# Patient Record
Sex: Female | Born: 1965 | Race: White | Hispanic: No | Smoking: Never smoker
Health system: Southern US, Community
[De-identification: ages and names within clinical notes are randomized; demographics above are authoritative.]

## PROBLEM LIST (undated history)

## (undated) DIAGNOSIS — IMO0002 Reserved for concepts with insufficient information to code with codable children: Secondary | ICD-10-CM

## (undated) DIAGNOSIS — D219 Benign neoplasm of connective and other soft tissue, unspecified: Secondary | ICD-10-CM

## (undated) DIAGNOSIS — R87619 Unspecified abnormal cytological findings in specimens from cervix uteri: Secondary | ICD-10-CM

## (undated) DIAGNOSIS — J45909 Unspecified asthma, uncomplicated: Secondary | ICD-10-CM

## (undated) DIAGNOSIS — K589 Irritable bowel syndrome without diarrhea: Secondary | ICD-10-CM

## (undated) HISTORY — PX: ABDOMINOPLASTY: SUR9

## (undated) HISTORY — PX: CERVICAL CONE BIOPSY: SUR198

## (undated) HISTORY — DX: Unspecified asthma, uncomplicated: J45.909

## (undated) HISTORY — PX: OTHER SURGICAL HISTORY: SHX169

---

## 1998-12-02 ENCOUNTER — Inpatient Hospital Stay (HOSPITAL_COMMUNITY): Admission: AD | Admit: 1998-12-02 | Discharge: 1998-12-05 | Payer: Self-pay | Admitting: *Deleted

## 1998-12-31 ENCOUNTER — Encounter (HOSPITAL_COMMUNITY): Admission: RE | Admit: 1998-12-31 | Discharge: 1999-03-31 | Payer: Self-pay | Admitting: *Deleted

## 1999-01-03 ENCOUNTER — Inpatient Hospital Stay (HOSPITAL_COMMUNITY): Admission: AD | Admit: 1999-01-03 | Discharge: 1999-01-03 | Payer: Self-pay | Admitting: Obstetrics and Gynecology

## 1999-01-16 ENCOUNTER — Other Ambulatory Visit: Admission: RE | Admit: 1999-01-16 | Discharge: 1999-01-16 | Payer: Self-pay | Admitting: *Deleted

## 1999-07-19 ENCOUNTER — Emergency Department (HOSPITAL_COMMUNITY): Admission: EM | Admit: 1999-07-19 | Discharge: 1999-07-19 | Payer: Self-pay | Admitting: Emergency Medicine

## 2000-03-29 ENCOUNTER — Other Ambulatory Visit: Admission: RE | Admit: 2000-03-29 | Discharge: 2000-03-29 | Payer: Self-pay | Admitting: *Deleted

## 2001-03-14 ENCOUNTER — Encounter: Admission: RE | Admit: 2001-03-14 | Discharge: 2001-03-14 | Payer: Self-pay | Admitting: *Deleted

## 2001-03-14 ENCOUNTER — Encounter: Payer: Self-pay | Admitting: *Deleted

## 2001-03-27 ENCOUNTER — Encounter: Payer: Self-pay | Admitting: *Deleted

## 2001-03-27 ENCOUNTER — Encounter: Admission: RE | Admit: 2001-03-27 | Discharge: 2001-03-27 | Payer: Self-pay | Admitting: *Deleted

## 2001-04-07 ENCOUNTER — Ambulatory Visit (HOSPITAL_COMMUNITY): Admission: RE | Admit: 2001-04-07 | Discharge: 2001-04-07 | Payer: Self-pay | Admitting: Internal Medicine

## 2001-04-07 ENCOUNTER — Encounter (HOSPITAL_BASED_OUTPATIENT_CLINIC_OR_DEPARTMENT_OTHER): Payer: Self-pay | Admitting: Internal Medicine

## 2001-06-16 ENCOUNTER — Other Ambulatory Visit: Admission: RE | Admit: 2001-06-16 | Discharge: 2001-06-16 | Payer: Self-pay | Admitting: *Deleted

## 2002-05-01 ENCOUNTER — Encounter: Admission: RE | Admit: 2002-05-01 | Discharge: 2002-05-01 | Payer: Self-pay | Admitting: Orthopaedic Surgery

## 2002-07-31 ENCOUNTER — Other Ambulatory Visit: Admission: RE | Admit: 2002-07-31 | Discharge: 2002-07-31 | Payer: Self-pay | Admitting: Obstetrics and Gynecology

## 2002-09-26 ENCOUNTER — Encounter: Payer: Self-pay | Admitting: *Deleted

## 2002-09-26 ENCOUNTER — Ambulatory Visit (HOSPITAL_COMMUNITY): Admission: RE | Admit: 2002-09-26 | Discharge: 2002-09-26 | Payer: Self-pay | Admitting: *Deleted

## 2003-08-14 ENCOUNTER — Other Ambulatory Visit: Admission: RE | Admit: 2003-08-14 | Discharge: 2003-08-14 | Payer: Self-pay | Admitting: Obstetrics and Gynecology

## 2003-09-11 ENCOUNTER — Encounter (INDEPENDENT_AMBULATORY_CARE_PROVIDER_SITE_OTHER): Payer: Self-pay | Admitting: Specialist

## 2003-09-11 ENCOUNTER — Ambulatory Visit (HOSPITAL_COMMUNITY): Admission: RE | Admit: 2003-09-11 | Discharge: 2003-09-11 | Payer: Self-pay | Admitting: *Deleted

## 2004-01-28 ENCOUNTER — Emergency Department (HOSPITAL_COMMUNITY): Admission: EM | Admit: 2004-01-28 | Discharge: 2004-01-28 | Payer: Self-pay | Admitting: Emergency Medicine

## 2004-01-31 ENCOUNTER — Encounter: Admission: RE | Admit: 2004-01-31 | Discharge: 2004-01-31 | Payer: Self-pay | Admitting: *Deleted

## 2004-02-10 ENCOUNTER — Encounter: Admission: RE | Admit: 2004-02-10 | Discharge: 2004-05-10 | Payer: Self-pay | Admitting: *Deleted

## 2004-08-18 ENCOUNTER — Other Ambulatory Visit: Admission: RE | Admit: 2004-08-18 | Discharge: 2004-08-18 | Payer: Self-pay | Admitting: Obstetrics and Gynecology

## 2005-01-12 ENCOUNTER — Emergency Department (HOSPITAL_COMMUNITY): Admission: EM | Admit: 2005-01-12 | Discharge: 2005-01-12 | Payer: Self-pay | Admitting: Emergency Medicine

## 2005-02-02 ENCOUNTER — Encounter: Admission: RE | Admit: 2005-02-02 | Discharge: 2005-02-02 | Payer: Self-pay | Admitting: Obstetrics and Gynecology

## 2006-03-31 ENCOUNTER — Encounter: Admission: RE | Admit: 2006-03-31 | Discharge: 2006-03-31 | Payer: Self-pay | Admitting: Obstetrics and Gynecology

## 2007-06-21 ENCOUNTER — Emergency Department (HOSPITAL_COMMUNITY): Admission: EM | Admit: 2007-06-21 | Discharge: 2007-06-21 | Payer: Self-pay | Admitting: Family Medicine

## 2008-09-30 ENCOUNTER — Emergency Department (HOSPITAL_COMMUNITY): Admission: EM | Admit: 2008-09-30 | Discharge: 2008-10-01 | Payer: Self-pay | Admitting: Family Medicine

## 2009-04-25 ENCOUNTER — Encounter: Admission: RE | Admit: 2009-04-25 | Discharge: 2009-04-25 | Payer: Self-pay | Admitting: Family Medicine

## 2010-02-01 ENCOUNTER — Encounter: Payer: Self-pay | Admitting: Obstetrics and Gynecology

## 2010-05-29 NOTE — H&P (Signed)
[facility]  Patient:    Caitlyn Pruitt                          MRN: [id number] Adm. Date:  [id number] Attending:  Frederich Balding                         History and Physical  HISTORY OF PRESENT ILLNESS:   Patient is a 45 year old primigravida married white female, last menstrual period of February 17, her estimated date of confinement  November 24, and an estimated gestational age of 39+ weeks.  By early ultrasound, [facility] was November 14, giving her an estimated gestational age of [redacted] weeks.  Her prenatal course has basically been uncomplicated.                                She presented to the office yesterday, was evaluated by Dr. Malachy Mood.  An ultrasound at that time revealed an estimated fetal weight of  4283 grams.  Amniotic fluid index was normal.  Cervix was favorable, and the patient is brought in today to undergo artificial rupture of membranes for induction of labor.  Her group B strep is negative.  She did have an elevated one-hour glucose tolerance test.  However, her three-hour glucose tolerance test was within normal limits.  ALLERGIES:                    Patient has no known drug allergies.  MEDICATIONS:                  Include prenatal vitamins.  PRENATAL LABORATORY:          Patient is O positive, negative antibody screen, nonreactive serology, positive rubella screen, negative hepatitis B surface antigen.  PAST MEDICAL HISTORY, FAMILY HISTORY, SOCIAL HISTORY:      Please see prenatal records.  REVIEW OF SYSTEMS:            Noncontributory.  PHYSICAL EXAMINATION:  VITAL SIGNS:                  Patient afebrile, stable vital signs.  Blood pressure may be slightly elevated, 140-150/80-90.  LUNGS:                        Clear.  CARDIOVASCULAR:               Regular rate, grade 2/6 systolic ejection murmur, no clicks or gallops.  ABDOMEN:                      Gravid uterus.  Large estimated fetal  weight noted.  PELVIC:                       Cervix is 2 cm, 80% effaced, vertex, and -1 station. Artificial rupture of membranes revealed 1+ meconium-stained fluid.  EXTREMITIES:                  Deep tendon reflexes are 2+ and no clonus.  She does have 1+ edema.  FETAL HEART RATE:             Reactive without decelerations.  IMPRESSION:                   1. Intrauterine pregnancy at term with  large                                  estimated fetal weight.                               2. Meconium-stained amniotic fluid.  PLAN:                         In terms of the large estimated fetal weight, we ave given the patient two options.  There is the option of primary cesarean section for large estimated fetal weight versus trial of labor.  We have discussed the potential risks with fetal macrosomia in terms of birth trauma, with associated  shoulder dystocia and its implications.  Patient and her husband discussed their issues.  All questions were answered, and the patient has decided to proceed with the trial of labor.  Again, we will not proceed with any type of instrument delivery for a vaginal delivery.  In terms of the meconium-stained fluid, we discussed its implications and management.  Discussed the potential for meconium aspiration.  Because it is so light at this point in time, amnioinfusion will not be undertaken, unless it does become thicker.  We have discussed its management at delivery.  All questions of the patient were answered.  Will proceed with AROM.                                Because of slightly elevated blood pressure, PIH panel will be obtained. DD:  12/02/98 TD:  12/02/98 Job: [id number]

## 2010-05-29 NOTE — Op Note (Signed)
NAME:  Caitlyn Pruitt, Caitlyn Pruitt                          ACCOUNT NO.:  [id number]   MEDICAL RECORD NO.:  [id number]                   PATIENT TYPE:  AMB   LOCATION:  DAY                                  FACILITY:  [facility]   PHYSICIAN:  Vikki Ports, M.D.         DATE OF BIRTH:  28-Apr-1965   DATE OF PROCEDURE:  09/11/2003  DATE OF DISCHARGE:                                 OPERATIVE REPORT   PREOPERATIVE DIAGNOSIS:  Grade II and III internal hemorrhoids.   POSTOPERATIVE DIAGNOSIS:  Grade II and III internal hemorrhoids.   PROCEDURE:  Procedure for prolapse and hemorrhoids rectopexy.   SURGEON:  Vikki Ports, M.D.   ANESTHESIA:  General.   DESCRIPTION OF PROCEDURE:  Patient was taken to the operating room and  placed in a supine position.  After adequate endotracheal anesthesia was  induced using an endotracheal tube, she was placed in a prone jack-knife  position.  Rectal and perianal prep was undertaken.  The three hemorrhoidal  bundles were injected with 0.5% Marcaine with Wydase, and then the internal  and external sphincter muscles were injected with 30 cc of 0.5% Marcaine.  First, anal dilatation was accomplished to three digits.  I then placed a 2-  0 purse-string suture circumferentially around the rectal mucosa 5 cm  proximal to the dentate line.  The stapler was then placed, fired, and  removed.  There was a good donut of tissue.  Prior to firing, I checked the  vagina, and there was no evidence of wall involvement.  The staple line was  assessed.  Adequate hemostasis was insured.  Gelfoam packing was placed in  the rectum.  Patient tolerated the procedure well and went to the PACU in  good condition.                                               Vikki Ports, M.D.    KRH/MEDQ  D:  09/11/2003  T:  09/11/2003  Job:  [id number]

## 2010-10-08 LAB — CBC
HCT: 40.7
MCHC: 34.4
RDW: 12.3

## 2010-10-08 LAB — DIFFERENTIAL
Basophils Relative: 1
Monocytes Relative: 9
Neutro Abs: 2.3

## 2010-10-13 ENCOUNTER — Other Ambulatory Visit: Payer: Self-pay | Admitting: Internal Medicine

## 2010-10-13 DIAGNOSIS — R1011 Right upper quadrant pain: Secondary | ICD-10-CM

## 2010-10-14 ENCOUNTER — Ambulatory Visit
Admission: RE | Admit: 2010-10-14 | Discharge: 2010-10-14 | Disposition: A | Discharge status: Home / Self Care | Payer: 59 | Source: Ambulatory Visit | Attending: Internal Medicine | Admitting: Internal Medicine

## 2010-10-14 DIAGNOSIS — R1011 Right upper quadrant pain: Secondary | ICD-10-CM

## 2010-12-16 ENCOUNTER — Ambulatory Visit
Admission: RE | Admit: 2010-12-16 | Discharge: 2010-12-16 | Disposition: A | Discharge status: Home / Self Care | Payer: 59 | Source: Ambulatory Visit | Attending: Allergy and Immunology | Admitting: Allergy and Immunology

## 2010-12-16 ENCOUNTER — Other Ambulatory Visit: Payer: Self-pay | Admitting: Allergy and Immunology

## 2010-12-16 DIAGNOSIS — R0602 Shortness of breath: Secondary | ICD-10-CM

## 2011-03-24 ENCOUNTER — Other Ambulatory Visit: Payer: Self-pay | Admitting: Family Medicine

## 2011-03-24 ENCOUNTER — Ambulatory Visit
Admission: RE | Admit: 2011-03-24 | Discharge: 2011-03-24 | Disposition: A | Discharge status: Home / Self Care | Payer: 59 | Source: Ambulatory Visit | Attending: Family Medicine | Admitting: Family Medicine

## 2011-03-24 DIAGNOSIS — M25511 Pain in right shoulder: Secondary | ICD-10-CM

## 2011-04-01 ENCOUNTER — Other Ambulatory Visit: Payer: Self-pay | Admitting: Family Medicine

## 2011-04-01 DIAGNOSIS — M25511 Pain in right shoulder: Secondary | ICD-10-CM

## 2011-04-02 ENCOUNTER — Other Ambulatory Visit: Payer: Self-pay | Admitting: Orthopedic Surgery

## 2011-04-02 DIAGNOSIS — M25511 Pain in right shoulder: Secondary | ICD-10-CM

## 2011-04-07 ENCOUNTER — Ambulatory Visit
Admission: RE | Admit: 2011-04-07 | Discharge: 2011-04-07 | Disposition: A | Discharge status: Home / Self Care | Payer: 59 | Source: Ambulatory Visit | Attending: Orthopedic Surgery | Admitting: Orthopedic Surgery

## 2011-04-07 DIAGNOSIS — M25511 Pain in right shoulder: Secondary | ICD-10-CM

## 2011-09-15 ENCOUNTER — Encounter (HOSPITAL_COMMUNITY): Payer: Self-pay | Admitting: Emergency Medicine

## 2011-09-15 DIAGNOSIS — R1011 Right upper quadrant pain: Secondary | ICD-10-CM | POA: Insufficient documentation

## 2011-09-15 LAB — URINALYSIS, ROUTINE W REFLEX MICROSCOPIC
Glucose, UA: NEGATIVE mg/dL
Ketones, ur: NEGATIVE mg/dL
Nitrite: NEGATIVE
Urobilinogen, UA: 0.2 mg/dL (ref 0.0–1.0)

## 2011-09-15 LAB — POCT PREGNANCY, URINE: Preg Test, Ur: NEGATIVE

## 2011-09-15 LAB — CBC WITH DIFFERENTIAL/PLATELET
Basophils Absolute: 0.1 10*3/uL (ref 0.0–0.1)
Eosinophils Relative: 2 % (ref 0–5)
HCT: 38.8 % (ref 36.0–46.0)
Lymphocytes Relative: 26 % (ref 12–46)
MCH: 30.3 pg (ref 26.0–34.0)
MCHC: 34 g/dL (ref 30.0–36.0)
Neutrophils Relative %: 66 % (ref 43–77)
RDW: 12.5 % (ref 11.5–15.5)

## 2011-09-15 LAB — URINE MICROSCOPIC-ADD ON

## 2011-09-15 NOTE — ED Notes (Signed)
RUQ pain she believes is gallbladder related; pain is constant. Pain after ovulation on right side. Has hx of fibroids removed.

## 2011-09-16 ENCOUNTER — Emergency Department (HOSPITAL_COMMUNITY)
Admission: EM | Admit: 2011-09-16 | Discharge: 2011-09-16 | Discharge status: Against Medical Advice | Payer: 59 | Attending: Emergency Medicine | Admitting: Emergency Medicine

## 2011-09-16 HISTORY — DX: Irritable bowel syndrome, unspecified: K58.9

## 2011-09-16 LAB — COMPREHENSIVE METABOLIC PANEL
AST: 196 U/L — ABNORMAL HIGH (ref 0–37)
BUN: 12 mg/dL (ref 6–23)
CO2: 27 mEq/L (ref 19–32)
Calcium: 9.9 mg/dL (ref 8.4–10.5)
GFR calc non Af Amer: 67 mL/min — ABNORMAL LOW (ref >90)
Potassium: 3.5 mEq/L (ref 3.5–5.1)
Total Protein: 6.9 g/dL (ref 6.0–8.3)

## 2011-09-30 ENCOUNTER — Other Ambulatory Visit: Payer: Self-pay | Admitting: Internal Medicine

## 2011-09-30 DIAGNOSIS — R1011 Right upper quadrant pain: Secondary | ICD-10-CM

## 2011-09-30 DIAGNOSIS — R748 Abnormal levels of other serum enzymes: Secondary | ICD-10-CM

## 2011-10-04 ENCOUNTER — Ambulatory Visit
Admission: RE | Admit: 2011-10-04 | Discharge: 2011-10-04 | Disposition: A | Discharge status: Home / Self Care | Payer: 59 | Source: Ambulatory Visit | Attending: Internal Medicine | Admitting: Internal Medicine

## 2011-10-04 DIAGNOSIS — R748 Abnormal levels of other serum enzymes: Secondary | ICD-10-CM

## 2011-10-04 DIAGNOSIS — R1011 Right upper quadrant pain: Secondary | ICD-10-CM

## 2011-10-05 ENCOUNTER — Other Ambulatory Visit: Payer: Self-pay | Admitting: Internal Medicine

## 2011-10-05 DIAGNOSIS — R109 Unspecified abdominal pain: Secondary | ICD-10-CM

## 2011-10-11 ENCOUNTER — Other Ambulatory Visit: Payer: 59

## 2011-10-18 ENCOUNTER — Other Ambulatory Visit: Payer: 59

## 2011-10-20 ENCOUNTER — Ambulatory Visit
Admission: RE | Admit: 2011-10-20 | Discharge: 2011-10-20 | Disposition: A | Discharge status: Home / Self Care | Payer: 59 | Source: Ambulatory Visit | Attending: Internal Medicine | Admitting: Internal Medicine

## 2011-10-20 DIAGNOSIS — R109 Unspecified abdominal pain: Secondary | ICD-10-CM

## 2011-10-20 MED ORDER — GADOBENATE DIMEGLUMINE 529 MG/ML IV SOLN
16.0000 mL | Freq: Once | INTRAVENOUS | Status: AC | PRN
  Administered 2011-10-20: 16 mL via INTRAVENOUS

## 2011-10-21 ENCOUNTER — Other Ambulatory Visit: Payer: 59

## 2011-10-26 ENCOUNTER — Other Ambulatory Visit: Payer: Self-pay | Admitting: Obstetrics and Gynecology

## 2012-01-02 ENCOUNTER — Ambulatory Visit (INDEPENDENT_AMBULATORY_CARE_PROVIDER_SITE_OTHER): Payer: 59 | Admitting: Family Medicine

## 2012-01-02 VITALS — BP 101/65 | HR 56 | Temp 98.4°F | Resp 17 | Ht 68.5 in | Wt 176.0 lb

## 2012-01-02 DIAGNOSIS — J3489 Other specified disorders of nose and nasal sinuses: Secondary | ICD-10-CM

## 2012-01-02 DIAGNOSIS — J310 Chronic rhinitis: Secondary | ICD-10-CM

## 2012-01-02 DIAGNOSIS — J019 Acute sinusitis, unspecified: Secondary | ICD-10-CM

## 2012-01-02 DIAGNOSIS — R0981 Nasal congestion: Secondary | ICD-10-CM

## 2012-01-02 MED ORDER — IPRATROPIUM BROMIDE 0.02 % IN SOLN: 500.0000 ug | Freq: Four times a day (QID) | RESPIRATORY_TRACT | Status: DC

## 2012-01-02 MED ORDER — AZITHROMYCIN 250 MG PO TABS: ORAL_TABLET | ORAL | Status: DC

## 2012-01-02 MED ORDER — BENZONATATE 100 MG PO CAPS: 100.0000 mg | ORAL_CAPSULE | Freq: Three times a day (TID) | ORAL | Status: DC | PRN

## 2012-01-02 NOTE — Progress Notes (Signed)
[facility]:  Office Visit  Chief Complaint:  Chief Complaint  Patient presents with  . Sinusitis    pressure and headache     HPI: Caitlyn Pruitt is a 46 y.o. female who complains of  Sinus pressure, tried ibuprofen, nasal washes, gargling, minimal fevers and chill, + frontal HA.  Green to brown.  No allergies or asthma. Has IBS and prior sinus issues. Cough but no sputum.   Past Medical History  Diagnosis Date  . Fibroids   . Irritable bowel syndrome (IBS)    Past Surgical History  Procedure Date  . Turbinate reconstruction    History   Social History  . Marital Status: Married    Spouse Name: N/A    Number of Children: N/A  . Years of Education: N/A   Social History Main Topics  . Smoking status: Never Smoker   . Smokeless tobacco: None  . Alcohol Use: None  . Drug Use: None  . Sexually Active: None   Other Topics Concern  . None   Social History Narrative  . None   No family history on file. No Known Allergies Prior to Admission medications   Medication Sig Start Date End Date Taking? Authorizing Provider  ALPRAZolam (XANAX) 0.25 MG tablet Take 0.125 mg by mouth at bedtime as needed.   Yes Historical Provider, MD  Azelaic Acid (FINACEA) 15 % cream Apply 1 application topically daily. After skin is thoroughly washed and patted dry, gently but thoroughly massage a thin film of azelaic acid cream into the affected area twice daily, in the morning and evening.   Yes Historical Provider, MD  fish oil-omega-3 fatty acids 1000 MG capsule Take 1 g by mouth daily.   Yes Historical Provider, MD  Magnesium Hydroxide (MAGNESIA PO) Take 1 tablet by mouth daily.   Yes Historical Provider, MD  Probiotic Product (ALIGN PO) Take 1 tablet by mouth daily.   Yes Historical Provider, MD     ROS: The patient denies fevers, chills, night sweats, unintentional weight loss, chest pain, palpitations, wheezing, dyspnea on exertion, nausea, vomiting, abdominal pain,  dysuria, hematuria, melena, numbness, weakness, or tingling.   All other systems have been reviewed and were otherwise negative with the exception of those mentioned in the HPI and as above.    PHYSICAL EXAM: Filed Vitals:   01/02/12 0938  BP: 101/65  Pulse: 56  Temp: 98.4 F (36.9 C)  Resp: 17   Filed Vitals:   01/02/12 0938  Height: 5' 8.5" (1.74 m)  Weight: 176 lb (79.833 kg)   Body mass index is 26.37 kg/(m^2).  General: Alert, no acute distress HEENT:  Normocephalic, atraumatic, oropharynx patent. + sinus tenderness, Tm nl Cardiovascular:  Regular rate and rhythm, no rubs murmurs or gallops.  No Carotid bruits, radial pulse intact. No pedal edema.  Respiratory: Clear to auscultation bilaterally.  No wheezes, rales, or rhonchi.  No cyanosis, no use of accessory musculature GI: No organomegaly, abdomen is soft and non-tender, positive bowel sounds.  No masses. Skin: No rashes. Neurologic: Facial musculature symmetric. Psychiatric: Patient is appropriate throughout our interaction. Lymphatic: No cervical lymphadenopathy Musculoskeletal: Gait intact.   LABS:    EKG/XRAY:   Primary read interpreted by Dr. Conley Rolls at [facility].   ASSESSMENT/PLAN: Encounter Diagnoses  Name Primary?  . Acute sinusitis Yes  . Rhinitis, non-allergic   . Nasal congestion    Does not want augmentin, would like z pack due to IBS Will Rx z pack, tessalon  perels, atrovent NS F/u prn    Shakedra Beam PHUONG, DO 01/02/2012 10:38 AM

## 2012-01-03 ENCOUNTER — Telehealth: Payer: Self-pay | Admitting: Radiology

## 2012-01-03 MED ORDER — IPRATROPIUM BROMIDE 0.03 % NA SOLN: 2.0000 | Freq: Two times a day (BID) | NASAL | Status: DC

## 2012-01-03 NOTE — Telephone Encounter (Signed)
Call from pharmacy, got atrovent solution for nebulizer. She does not have a nebulizer, reviewed chart this is to be nasal spray. I have corrected this.

## 2012-01-06 ENCOUNTER — Telehealth: Payer: Self-pay

## 2012-01-06 MED ORDER — IPRATROPIUM BROMIDE 0.02 % IN SOLN: 500.0000 ug | Freq: Four times a day (QID) | RESPIRATORY_TRACT | Status: DC

## 2012-01-06 NOTE — Telephone Encounter (Signed)
ipratropium (ATROVENT) 0.03 % nasal spray   Needs an inhaler, spray isn't what she was to get, needs the nebulizer.  Call [phone]  [facility] [address]

## 2012-01-06 NOTE — Telephone Encounter (Signed)
I sent in the nebulizer solution, pharmacy had called previously to state patient needed nasal spray, not nebulizer solution, now patient states she is to get nebulizer, where is she to get the machine?

## 2012-01-07 ENCOUNTER — Other Ambulatory Visit: Payer: Self-pay | Admitting: Family Medicine

## 2012-01-07 DIAGNOSIS — J9801 Acute bronchospasm: Secondary | ICD-10-CM

## 2012-01-07 MED ORDER — ALBUTEROL SULFATE HFA 108 (90 BASE) MCG/ACT IN AERS: 2.0000 | INHALATION_SPRAY | Freq: Four times a day (QID) | RESPIRATORY_TRACT | Status: DC | PRN

## 2012-01-07 NOTE — Telephone Encounter (Signed)
She is having bronchospasms, but feels much better, would like a rx for albuterol INH with spacer. I have ordered it to same pharmacy.

## 2012-01-07 NOTE — Telephone Encounter (Signed)
LM for her to call me back, need clarification on why she ants nebs since she was not wheezing, does not have h/o asthma.

## 2012-02-10 ENCOUNTER — Ambulatory Visit
Admission: RE | Admit: 2012-02-10 | Discharge: 2012-02-10 | Disposition: A | Discharge status: Home / Self Care | Payer: 59 | Source: Ambulatory Visit | Attending: Allergy and Immunology | Admitting: Allergy and Immunology

## 2012-02-10 ENCOUNTER — Other Ambulatory Visit: Payer: Self-pay | Admitting: Allergy and Immunology

## 2012-02-10 DIAGNOSIS — R0602 Shortness of breath: Secondary | ICD-10-CM

## 2012-02-19 ENCOUNTER — Ambulatory Visit (INDEPENDENT_AMBULATORY_CARE_PROVIDER_SITE_OTHER): Payer: 59 | Admitting: Emergency Medicine

## 2012-02-19 VITALS — BP 121/83 | HR 70 | Temp 98.2°F | Resp 16 | Ht 69.0 in | Wt 172.0 lb

## 2012-02-19 DIAGNOSIS — R05 Cough: Secondary | ICD-10-CM

## 2012-02-19 DIAGNOSIS — R0989 Other specified symptoms and signs involving the circulatory and respiratory systems: Secondary | ICD-10-CM

## 2012-02-19 DIAGNOSIS — Z9109 Other allergy status, other than to drugs and biological substances: Secondary | ICD-10-CM | POA: Insufficient documentation

## 2012-02-19 DIAGNOSIS — R059 Cough, unspecified: Secondary | ICD-10-CM

## 2012-02-19 DIAGNOSIS — J45909 Unspecified asthma, uncomplicated: Secondary | ICD-10-CM | POA: Insufficient documentation

## 2012-02-19 DIAGNOSIS — J209 Acute bronchitis, unspecified: Secondary | ICD-10-CM

## 2012-02-19 HISTORY — DX: Unspecified asthma, uncomplicated: J45.909

## 2012-02-19 MED ORDER — ALBUTEROL SULFATE (2.5 MG/3ML) 0.083% IN NEBU: 2.5000 mg | INHALATION_SOLUTION | Freq: Once | RESPIRATORY_TRACT | Status: DC

## 2012-02-19 MED ORDER — PREDNISONE 10 MG PO TABS: ORAL_TABLET | ORAL | Status: DC

## 2012-02-19 MED ORDER — AMOXICILLIN 875 MG PO TABS: 875.0000 mg | ORAL_TABLET | Freq: Two times a day (BID) | ORAL | Status: DC

## 2012-02-19 NOTE — Progress Notes (Signed)
  Subjective:    Patient ID: Caitlyn Pruitt, female    DOB: 11/30/1965, 47 y.o.   MRN: [id number]  HPI Patient presents with productive cough and chest congestion.  She is also experiencing stress where she is not eating or sleeping well.  She was sick the beginning of January and was placed on an antibiotic and inhaler.  She felt better, but still had lingering symptoms. Her son was sick 10 days ago and she started  to feel congestion, cough and drainage.      Review of Systems she is under a significant amount of stress at home. She is having difficulty with her children her husband is having an affair and moving to [place] and she's not been able to sleep. She has been seeing a therapist regular     Objective:   Physical Exam patient is alert cooperative in no distress. Her TMs are clear. Nose is somewhat congested. The throat is status post TNA. Neck is supple. Chest exam reveals rhonchi present bilaterally minimal wheezing noted.        Assessment & Plan:  The patient his son after nebulizer treatment we'll go ahead and treat with amoxicillin 875 twice a day along with prednisone taper in that she responded well to this before. I encouraged her to see her therapist regular to help her with her life stresses.

## 2012-02-21 ENCOUNTER — Telehealth: Payer: Self-pay

## 2012-02-21 MED ORDER — ALBUTEROL SULFATE (2.5 MG/3ML) 0.083% IN NEBU: 2.5000 mg | INHALATION_SOLUTION | Freq: Four times a day (QID) | RESPIRATORY_TRACT | Status: DC | PRN

## 2012-02-21 NOTE — Telephone Encounter (Signed)
Sending in Rx for Albuterol for neb to Caitlyn Pruitt and faxed Rx for nebulizer to [facility]. Notified pt that both were done and pt will CB if she hasn't heard from [facility] in a couple of days.

## 2012-02-21 NOTE — Telephone Encounter (Signed)
PT STATES SHE WAS GIVEN A BREATHING TREATMENT WHEN SHE CAME IN THE OTHER DAY AND WOULD LIKE TO KNOW WHAT WAS IN IT BECAUSE IT WORKED SO MUCH BETTER THAN WHAT SHE USE TO HAVE. PLEASE CALL [phone]

## 2012-02-21 NOTE — Telephone Encounter (Signed)
Reported to pt that she was given albuterol in her nebulizer treatment at [facility]. Pt stated that it works so much better than her inhaler and requests Rx for a nebulizer machine and the albuterol to use in the machine if possible. Dr Cleta Alberts, do you want to Rx these for pt?

## 2012-02-21 NOTE — Telephone Encounter (Signed)
IT would be okay to send a prescription into [facility] for nebulizer. She would have to change to the albuterol 2.5 mg per 3 cc unit dose  for the  nebulizer. She can have #25 with one refill

## 2012-02-29 ENCOUNTER — Other Ambulatory Visit: Payer: Self-pay | Admitting: Allergy and Immunology

## 2012-02-29 ENCOUNTER — Ambulatory Visit
Admission: RE | Admit: 2012-02-29 | Discharge: 2012-02-29 | Disposition: A | Discharge status: Home / Self Care | Payer: 59 | Source: Ambulatory Visit | Attending: Allergy and Immunology | Admitting: Allergy and Immunology

## 2012-02-29 DIAGNOSIS — J45901 Unspecified asthma with (acute) exacerbation: Secondary | ICD-10-CM

## 2012-03-18 ENCOUNTER — Other Ambulatory Visit: Payer: Self-pay | Admitting: Emergency Medicine

## 2012-03-20 NOTE — Telephone Encounter (Signed)
If she still needs this, she needs to RTC or follow-up with her PCP.

## 2012-03-29 ENCOUNTER — Other Ambulatory Visit: Payer: Self-pay | Admitting: Emergency Medicine

## 2012-03-30 ENCOUNTER — Ambulatory Visit
Admission: RE | Admit: 2012-03-30 | Discharge: 2012-03-30 | Disposition: A | Discharge status: Home / Self Care | Payer: 59 | Source: Ambulatory Visit | Attending: Internal Medicine | Admitting: Internal Medicine

## 2012-03-30 ENCOUNTER — Other Ambulatory Visit: Payer: Self-pay | Admitting: Internal Medicine

## 2012-03-30 DIAGNOSIS — R059 Cough, unspecified: Secondary | ICD-10-CM

## 2012-03-30 DIAGNOSIS — R05 Cough: Secondary | ICD-10-CM

## 2012-03-31 ENCOUNTER — Ambulatory Visit
Admission: RE | Admit: 2012-03-31 | Discharge: 2012-03-31 | Disposition: A | Discharge status: Home / Self Care | Payer: 59 | Source: Ambulatory Visit | Attending: Internal Medicine | Admitting: Internal Medicine

## 2012-05-02 ENCOUNTER — Other Ambulatory Visit: Payer: Self-pay | Admitting: Obstetrics and Gynecology

## 2012-05-23 ENCOUNTER — Institutional Professional Consult (permissible substitution): Payer: 59 | Admitting: Internal Medicine

## 2012-05-31 ENCOUNTER — Institutional Professional Consult (permissible substitution): Payer: 59 | Admitting: Emergency Medicine

## 2012-06-20 ENCOUNTER — Other Ambulatory Visit: Payer: Self-pay | Admitting: Orthopedic Surgery

## 2012-06-20 DIAGNOSIS — M542 Cervicalgia: Secondary | ICD-10-CM

## 2012-06-26 ENCOUNTER — Other Ambulatory Visit: Payer: 59

## 2012-06-29 ENCOUNTER — Ambulatory Visit
Admission: RE | Admit: 2012-06-29 | Discharge: 2012-06-29 | Disposition: A | Discharge status: Home / Self Care | Payer: 59 | Source: Ambulatory Visit | Attending: Orthopedic Surgery | Admitting: Orthopedic Surgery

## 2012-06-29 DIAGNOSIS — M542 Cervicalgia: Secondary | ICD-10-CM

## 2012-07-06 ENCOUNTER — Encounter: Payer: Self-pay | Admitting: Emergency Medicine

## 2012-07-06 ENCOUNTER — Ambulatory Visit (INDEPENDENT_AMBULATORY_CARE_PROVIDER_SITE_OTHER): Payer: 59 | Admitting: Emergency Medicine

## 2012-07-06 VITALS — HR 77 | Temp 98.3°F | Ht 69.0 in | Wt 174.2 lb

## 2012-07-06 DIAGNOSIS — J45909 Unspecified asthma, uncomplicated: Secondary | ICD-10-CM

## 2012-07-06 DIAGNOSIS — J4520 Mild intermittent asthma, uncomplicated: Secondary | ICD-10-CM

## 2012-07-06 NOTE — Patient Instructions (Addendum)
We will stop your flovent and singulair for now Continue to use fluticasone nasal spray Have albuterol available to use as needed We will consider a pH probe test at some point in the future We will perform full Pulmonary Function Testing at your next office visit Follow with Dr Delton Coombes next available appointment with full PFT.

## 2012-07-06 NOTE — Assessment & Plan Note (Addendum)
Clinical picture sounds like asthma, with apparent triggers. Suspect one of these is allergic rhinitis - not currently active but has often been a contributor. Also may be a component of GERD. She is hesitant to start empiric PPI, but would be willing to work up further at some point if we deem necessary.   We will stop your flovent and singulair for now so that these will not affect PFT Continue to use fluticasone nasal spray Have albuterol available to use as needed We will consider a pH probe test at some point in the future We will perform full Pulmonary Function Testing at your next office visit Follow with Dr Delton Coombes next available appointment with full PFT.

## 2012-07-06 NOTE — Progress Notes (Signed)
Subjective:    Patient ID: Caitlyn Pruitt, female    DOB: August 11, 1965, 47 y.o.   MRN: [id number]  HPI 47 yo never smoker, hx of IBS, allergic rhinitis, asthma dx by Dr Irena Cords in march '14. She has family hx of COPD and asthma.  She reports URI sx last Fall, has never really felt well since. Has experienced wheeze, cough. Has identified triggers of cold air, some fumes, exercise, possibly GERD. Treated with pred + abx. Was evaluated by Dr Jarold Motto, underwent spirometry. Has been treated with atrovent/pulmocort nebs not currently, flovent since March. She has noted a clinical response to albuterol.  She is on fluticasone, singulair.    Review of Systems  Constitutional: Negative for fever and unexpected weight change.  HENT: Negative for ear pain, nosebleeds, congestion, sore throat, rhinorrhea, sneezing, trouble swallowing, dental problem, postnasal drip and sinus pressure.   Eyes: Negative for redness and itching.  Respiratory: Positive for cough and shortness of breath. Negative for chest tightness and wheezing.   Cardiovascular: Negative for palpitations and leg swelling.  Gastrointestinal: Negative for nausea and vomiting.  Genitourinary: Negative for dysuria.  Musculoskeletal: Negative for joint swelling.  Skin: Negative for rash.  Neurological: Negative for headaches.  Hematological: Does not bruise/bleed easily.  Psychiatric/Behavioral: Negative for dysphoric mood. The patient is not nervous/anxious.    Past Medical History  Diagnosis Date  . Fibroids   . Irritable bowel syndrome (IBS)   . Asthma 02/19/2012     Family History  Problem Relation Age of Onset  . Heart disease Mother   . Heart disease Father   . Heart disease Sister   . Heart disease Brother   . Emphysema Father   . Emphysema Mother   . Asthma Mother   . Cancer Maternal Grandfather     leukemia  . Cancer Mother     squamous cell skin cancer     History   Social History  . Marital Status: Married   Spouse Name: N/A    Number of Children: 0  . Years of Education: N/A   Occupational History  . homemaker    Social History Main Topics  . Smoking status: Never Smoker   . Smokeless tobacco: Never Used  . Alcohol Use: Yes     Comment: social  . Drug Use: No  . Sexually Active: Not on file   Other Topics Concern  . Not on file   Social History Narrative  . No narrative on file     No Known Allergies   Outpatient Prescriptions Prior to Visit  Medication Sig Dispense Refill  . albuterol (PROVENTIL HFA;VENTOLIN HFA) 108 (90 BASE) MCG/ACT inhaler Inhale 2 puffs into the lungs every 6 (six) hours as needed for wheezing. Use with spacer  1 Inhaler  0  . albuterol (PROVENTIL) (2.5 MG/3ML) 0.083% nebulizer solution USE VIA NEBULIZER EVERY 6 HOURS AS NEEDED FOR WHEEZING  75 mL  4  . ALPRAZolam (XANAX) 0.25 MG tablet Take 0.125 mg by mouth at bedtime as needed.      . Azelaic Acid (FINACEA) 15 % cream Apply 1 application topically daily. After skin is thoroughly washed and patted dry, gently but thoroughly massage a thin film of azelaic acid cream into the affected area twice daily, in the morning and evening.      . fluticasone (FLONASE) 50 MCG/ACT nasal spray Place 2 sprays into the nose daily.      . fluticasone (FLOVENT HFA) 110 MCG/ACT inhaler Inhale 1  puff into the lungs 2 (two) times daily.      . Magnesium Hydroxide (MAGNESIA PO) Take 1 tablet by mouth daily.      . Probiotic Product (ALIGN PO) Take 1 tablet by mouth daily.      Marland Kitchen amoxicillin (AMOXIL) 875 MG tablet Take 1 tablet (875 mg total) by mouth 2 (two) times daily.  20 tablet  0  . predniSONE (DELTASONE) 10 MG tablet Take 4 a day for 3 days 3 a day for 3 days 2 a day for 3 days one a day  30 tablet  0   Facility-Administered Medications Prior to Visit  Medication Dose Route Frequency Provider Last Rate Last Dose  . albuterol (PROVENTIL) (2.5 MG/3ML) 0.083% nebulizer solution 2.5 mg  2.5 mg Nebulization Once Collene Gobble, MD           Objective:   Physical Exam Filed Vitals:   07/06/12 1506  Pulse: 77  Temp: 98.3 F (36.8 C)  TempSrc: Oral  Height: 5\' 9"  (1.753 m)  Weight: 174 lb 3.2 oz (79.017 kg)  SpO2: 97%  Gen: Pleasant, well-nourished, in no distress,  normal affect  ENT: No lesions,  mouth clear,  oropharynx clear, no postnasal drip  Neck: No JVD, no TMG, no carotid bruits  Lungs: No use of accessory muscles, no dullness to percussion, clear without rales or rhonchi  Cardiovascular: RRR, heart sounds normal, no murmur or gallops, no peripheral edema  Musculoskeletal: No deformities, no cyanosis or clubbing  Neuro: alert, non focal  Skin: Warm, no lesions or rashes     Assessment & Plan:  Asthma Clinical picture sounds like asthma, with apparent triggers. Suspect one of these is allergic rhinitis - not currently active but has often been a contributor. Also may be a component of GERD. She is hesitant to start empiric PPI, but would be willing to work up further at some point if we deem necessary.   We will stop your flovent and singulair for now so that these will not affect PFT Continue to use fluticasone nasal spray Have albuterol available to use as needed We will consider a pH probe test at some point in the future We will perform full Pulmonary Function Testing at your next office visit Follow with Dr Delton Coombes next available appointment with full PFT.

## 2012-08-22 ENCOUNTER — Other Ambulatory Visit: Payer: Self-pay | Admitting: Obstetrics and Gynecology

## 2012-08-29 ENCOUNTER — Ambulatory Visit (INDEPENDENT_AMBULATORY_CARE_PROVIDER_SITE_OTHER): Payer: 59 | Admitting: Emergency Medicine

## 2012-08-29 ENCOUNTER — Encounter: Payer: Self-pay | Admitting: Emergency Medicine

## 2012-08-29 VITALS — BP 122/82 | HR 77 | Temp 98.2°F | Ht 68.0 in | Wt 175.0 lb

## 2012-08-29 DIAGNOSIS — J45909 Unspecified asthma, uncomplicated: Secondary | ICD-10-CM

## 2012-08-29 LAB — PULMONARY FUNCTION TEST

## 2012-08-29 NOTE — Progress Notes (Signed)
PFT done today. 

## 2012-08-29 NOTE — Patient Instructions (Addendum)
Continue to use albuterol as needed and before exercise.  Continue fluticasone nasal spray Restart your singulair daily Start omeprazole 20mg  daily Start either loratadine or allegra once a day after several weeks to see if they give additional benefit to the above Call in several weeks to update [place] on which combination is helpful Follow with Dr Delton Coombes in 6 months or sooner if you have any problems

## 2012-08-29 NOTE — Progress Notes (Signed)
  Subjective:    Patient ID: Caitlyn Pruitt, female    DOB: November 14, 1965, 47 y.o.   MRN: [id number]  HPI 47 yo never smoker, hx of IBS, allergic rhinitis, asthma dx by Dr Irena Cords in march '14. She has family hx of COPD and asthma.  She reports URI sx last Fall, has never really felt well since. Has experienced wheeze, cough. Has identified triggers of cold air, some fumes, exercise, possibly GERD. Treated with pred + abx. Was evaluated by Dr Jarold Motto, underwent spirometry. Has been treated with atrovent/pulmocort nebs not currently, flovent since March. She has noted a clinical response to albuterol.  She is on fluticasone, singulair.   ROV 08/29/12 -- follows up for dyspnea, allergies, suspected asthma. Suspected contributions from allergies and GERD (not currently treated). She is off singulair, flovent. She has been on fluticasone. 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 She continues to cough. Uses and benefits from albuterol before exercise.    Review of Systems  Constitutional: Negative for fever and unexpected weight change.  HENT: Negative for ear pain, nosebleeds, congestion, sore throat, rhinorrhea, sneezing, trouble swallowing, dental problem, postnasal drip and sinus pressure.   Eyes: Negative for redness and itching.  Respiratory: Positive for cough and shortness of breath. Negative for chest tightness and wheezing.   Cardiovascular: Negative for palpitations and leg swelling.  Gastrointestinal: Negative for nausea and vomiting.  Genitourinary: Negative for dysuria.  Musculoskeletal: Negative for joint swelling.  Skin: Negative for rash.  Neurological: Negative for headaches.  Hematological: Does not bruise/bleed easily.  Psychiatric/Behavioral: Negative for dysphoric mood. The patient is not nervous/anxious.       Objective:   Physical Exam Filed Vitals:   08/29/12 1409  BP: 122/82  Pulse: 77  Temp: 98.2 F (36.8 C)  TempSrc: Oral  Height: 5\' 8"  (1.727 m)   Weight: 175 lb (79.379 kg)  SpO2: 100%  Gen: Pleasant, well-nourished, in no distress,  normal affect  ENT: No lesions,  mouth clear,  oropharynx clear, no postnasal drip  Neck: No JVD, no TMG, no carotid bruits  Lungs: No use of accessory muscles, no dullness to percussion, clear without rales or rhonchi  Cardiovascular: RRR, heart sounds normal, no murmur or gallops, no peripheral edema  Musculoskeletal: No deformities, no cyanosis or clubbing  Neuro: alert, non focal  Skin: Warm, no lesions or rashes     Assessment & Plan:  Asthma Her PFT suggest AFL but aren't definitive. I suspect she does have mild asthma + very clear UA irritation.   Continue to use albuterol as needed and before exercise.  Continue fluticasone nasal spray Restart your singulair daily Start omeprazole 20mg  daily Start either loratadine or allegra once a day after several weeks to see if they give additional benefit to the above Call in several weeks to update [place] on which combination is helpful Follow with Dr Delton Coombes in 6 months or sooner if you have any problems

## 2012-08-29 NOTE — Assessment & Plan Note (Signed)
Her PFT suggest AFL but aren't definitive. I suspect she does have mild asthma + very clear UA irritation.   Continue to use albuterol as needed and before exercise.  Continue fluticasone nasal spray Restart your singulair daily Start omeprazole 20mg  daily Start either loratadine or allegra once a day after several weeks to see if they give additional benefit to the above Call in several weeks to update [place] on which combination is helpful Follow with Dr Delton Coombes in 6 months or sooner if you have any problems

## 2012-09-01 ENCOUNTER — Other Ambulatory Visit: Payer: Self-pay | Admitting: Obstetrics and Gynecology

## 2012-09-18 ENCOUNTER — Encounter (INDEPENDENT_AMBULATORY_CARE_PROVIDER_SITE_OTHER): Payer: Self-pay | Admitting: General Surgery

## 2012-09-18 ENCOUNTER — Ambulatory Visit (INDEPENDENT_AMBULATORY_CARE_PROVIDER_SITE_OTHER): Payer: Self-pay | Admitting: General Surgery

## 2012-09-18 VITALS — BP 132/82 | HR 64 | Temp 98.0°F | Resp 15 | Ht 69.0 in | Wt 175.8 lb

## 2012-09-18 DIAGNOSIS — B977 Papillomavirus as the cause of diseases classified elsewhere: Secondary | ICD-10-CM

## 2012-09-18 NOTE — Assessment & Plan Note (Addendum)
Refer pt to Dr. Maisie Fus.   Have made appt next week with her.   I did not charge pt for appt.

## 2012-09-18 NOTE — Progress Notes (Signed)
High risk HPV infection Refer pt to Dr. Maisie Fus.   Have made appt next week with her.   I did not charge pt for appt.

## 2012-09-18 NOTE — Patient Instructions (Signed)
Follow up next week with Dr. Maisie Fus.

## 2012-09-25 ENCOUNTER — Encounter (INDEPENDENT_AMBULATORY_CARE_PROVIDER_SITE_OTHER): Payer: Self-pay | Admitting: General Surgery

## 2012-09-25 ENCOUNTER — Ambulatory Visit (INDEPENDENT_AMBULATORY_CARE_PROVIDER_SITE_OTHER): Payer: BC Managed Care – PPO | Admitting: General Surgery

## 2012-09-25 VITALS — BP 122/76 | HR 72 | Temp 97.5°F | Resp 16 | Ht 69.0 in | Wt 176.0 lb

## 2012-09-25 DIAGNOSIS — L290 Pruritus ani: Secondary | ICD-10-CM

## 2012-09-25 NOTE — Patient Instructions (Addendum)
Return to the office in 6 months.  Try to perform a fleets enema prior to the apt     Pruritus Ani  What is Pruritus Ani (proo-r-tus a-n)? Itching around the anal area is called pruritus ani. This condition results in a compelling urge to scratch. What causes this to happen? Several factors may be at fault. A common cause is excessive moisture in the anal area. Moisture may be due to perspiration or a small amount of residual stool around the anal area. Pruritis ani may be a symptom of other common anal conditions such as hemorrhoids and anal fissures. The initial condition can be made worse by scratching, vigorous cleansing of the area or overuse of topical treatments. In some individuals pruritus ani may be caused by eating certain foods, smoking and drinking alcoholic beverages, especially beer and wine. Food items that have been associated with pruritus ani include: . Coffee, Tea . Carbonated beverages . Milk products . Tomatoes and tomato products such as Ketchup, spicey foods . Chocolate and Nuts  Idiopathic In over 75% of cases of pruritus, the cause of the pruritus is not known (i.e. idiopathic pruritus). Dermatitis Skin conditions such as dermatitis or psoriasis also can irritate the anus and result in anal pruritus. These may respond to corticosteroid creams.  Moisture Moisture due to excessive perspiration, frequent liquid stools (diarrhoea), or a degree of faecal incontinence where there is a weak anal sphincter leading to seepage can exacerbate this condition. Moisture can also result from an abnormal passageway communicating between the anus and external skin (anal fistula). A fistula brings contaminated and irritating fluids to the anal area. Moisture can also result from excessive mucous discharge, a common problem with haemorrhoids and rectal prolapse, where the mucous secreting mucosa of the anus drops below the anal sphincter (prolapses) . Infections Infection with  pinworm is common in those with young children and household pets. Less common is infestation with scabies or mites. These can all be tested for with skin scrapings or the "sellotape test" which are then sent off for viewing under microscopy. Yeast or fungal infections may occur if there is moisture around the anus. They more often occur in people who are immune-compromised including diabetics, transplant recipients, those taking chemotherapy, and those with HIV. Anal Cancer Anal cancer is uncommon, as are precancerous lesions (Bowen's and Paget's disease). However, when present, they may first present as a perianal itch. It is therefore important for your colorectal surgeon to examine the area, and on occasions a biopsy if required to exclude anal cancer.  Does Pruritus Ani result from lack of cleanliness? Cleanliness is almost never a factor. However, the natural tendency once a person develops this itching is to wash the area vigorously and frequently with soap and a washcloth. This almost always makes the problem worse by damaging the skin and washing away protective natural oils. What can be done to make this itching go away? A careful examination by a colon and rectal surgeon or other physician may identify a definite cause for the itching. Your physician can recommend treatment to eliminate the specific problem. Treatment of pruritus ani may include these three points. 1. AVOID MOISTURE in the anal area: . Apply either a few wisps of cotton, a 4 x 4 gauze or some cornstarch powder to keep the area dry. . Avoid all medicated, perfumed and deodorant powders. 2. AVOID FURTHER TRAUMA to the affected area: Marland Kitchen Do not use soap of any kind on the anal area. . Do  not scrub the anal area with anything - even toilet paper. . For hygiene, it is best to rinse with warm water and pat the area dry. Use wet toilet paper, baby wipes or a wet washcloth to blot the area clean. Never rub. . Try not to scratch the  itchy area. Scratching produces more damage, which in turn makes the itching worse. For individuals that experience irresistible itching at night, wearing socks on the hands may be helpful. 3. USE ONLY MEDICATIONS AS DIRECTED BY YOUR PHYSICIAN. Apply prescription medications sparingly to the skin around the anal area and avoid rubbing. Prolonged use of prescribed or over the counter topical medications may result in irritation or skin dryness that can make the condition worse. How long does this treatment usually take? Most people experience some relief from itching within a week. If symptoms do not resolve after 6 weeks, a follow-up appointment with your colon and rectal surgeon may be needed.             TREATMENT Management must be directed at breaking the "itch-scratch-itch" cycle as well as identifying causes and irritants and treating or avoiding these. It is important to clean and dry the anus thoroughly and avoid leaving soap in the anal area. Cleaning efforts should include gentle showering without direct rubbing or irritation of the skin with either the washcloth or towel. After bowel movements, wet cleaning of the perianal region either with a bidet or with moist wipes may be preferable to toilet paper. Scratching the affected area is to be resisted, as it only aggravates the problem and can lead to bleeding from the anal area. Synthetic underwear should be avoided. Irritant washing powders can also aggravate the problem. A gauze pad or combine, folded in half and placed between the buttocks so that it is in close proximity to the anus, is an effective way of reducing moisture to the region. BABY WIPES OR BIDET Baby wipes may be preferable to abrasive toilet paper and can help reduce friction, however perfumed baby wipes should be avoided. The [place] bidet used to wash the anal region after a bowel movement is an alternative to baby wipes. The conventional toilet can also have a bidet  appliance attached to it. BULKING AGENTS Anal pruritus is often exacerbated by watery stools. A tablespoon or sachet of ispaghula husk (Metamucil or Fibogel) twice a day, can firm loose stools. TOPICAL CREAMS AND OINTMENTS There are many over-the-counter creams or ointments that can be applied to the anus to reduce itch. Most of these creams have a barrier compound such as petroleum jelly (Vasoline) or zinc oxide that acts as a protectant and should be applied as a thin film to avoid excessive moisture. In addition they usually contain a small amount of one or more active ingredients. The active ingredients include an antiseptic (chlorhexidine), a local anaesthetic agents (lignocaine, benzocaine, cinchocaine) that numb the area, corticosteroids (hydrocortisone, fluocortolone, prednisolone) that reduce inflammation in the area, and vasoconstrictors (adrenaline) that make the blood vessels in the area become smaller, which may reduce swelling and help dry the area. Most creams just contain a corticosteroid with a local anaesthetic (Proctosedyl, Rectinol HC, Scheriproct, Ultraproct), others contain a vasoconstrictor with local anaesthetic (Rectinol). Some creams have all four active ingredients. ANTIHISTAMINES Antihistamines have been shown to reduce itch[1]. However most are sedative, and are best taken in the evening. CORTICOSTEROID CREAMS Stronger 1% corticosteroid ointments containing hydrocortisone (Egorcort Sigmacort) betamethasone (Diprosone) may be obtained with a prescription, and have been shown  to reduce inflammation and relieve itching [2-3]. They should not be used long term (i.e. more than a few days to two weeks), as chronic use can cause permanent damage to the skin. TOPICAL CAPSAICIN Topical capsaicin is a novel agent that has achieved success rates of up to 70%. It causes a low grade burning sensation, that over time, produces inhibitory neural feedback at the spinal cord level  which decreases the perception of itch[4]. ANAL TATTOOING WITH METHYLENE BLUE  For severe intractable cases that have not responded to the above measures, anal tattooing with intradermal injection of methylene blue has been described with success rates of 80%[5]. Significant adverse outcomes including skin necrosis have been reported. Good results, free of complication, have been achieved with a more dilute injection of methylene blue mixed with local anaesthetic agent and steroid [6]. The mechanism of action is thought to be due to destruction of nerve endings in the peri-anal skin.     2012 [employer]

## 2012-09-25 NOTE — Progress Notes (Signed)
Chief Complaint  Patient presents with  . New Evaluation    eval rectal cancer    HISTORY: Caitlyn Pruitt is a 47 y.o. female who presents to the office with concern for anal cancer after a cone biopsy showed cervical cancer.  She also complains of anal itching.  She is here today to mostly discuss her risk of anal cancer.  She denies any h/o anal condyloma.   Past Medical History  Diagnosis Date  . Fibroids   . Irritable bowel syndrome (IBS)   . Asthma 02/19/2012      Past Surgical History  Procedure Laterality Date  . Turbinate reconstruction    . Right shoulder decompression    . Cesarean section    . Cervical cone biopsy          Current Outpatient Prescriptions  Medication Sig Dispense Refill  . albuterol (PROVENTIL HFA;VENTOLIN HFA) 108 (90 BASE) MCG/ACT inhaler Inhale 2 puffs into the lungs every 6 (six) hours as needed for wheezing. Use with spacer  1 Inhaler  0  . albuterol (PROVENTIL) (2.5 MG/3ML) 0.083% nebulizer solution USE VIA NEBULIZER EVERY 6 HOURS AS NEEDED FOR WHEEZING  75 mL  4  . ALPRAZolam (XANAX) 0.25 MG tablet Take 0.125 mg by mouth at bedtime as needed.      . Azelaic Acid (FINACEA) 15 % cream Apply 1 application topically daily. After skin is thoroughly washed and patted dry, gently but thoroughly massage a thin film of azelaic acid cream into the affected area twice daily, in the morning and evening.      . budesonide (PULMICORT) 0.5 MG/2ML nebulizer solution Take 0.5 mg by nebulization as needed.      . Cyanocobalamin (VITAMIN B 12 PO) Take 1 tablet by mouth daily.      . ergocalciferol (VITAMIN D2) 50000 UNITS capsule Take 50,000 Units by mouth once a week.      . fluticasone (FLONASE) 50 MCG/ACT nasal spray Place 2 sprays into the nose daily.      . fluticasone (FLOVENT HFA) 110 MCG/ACT inhaler Inhale 1 puff into the lungs 2 (two) times daily.      Marland Kitchen ipratropium (ATROVENT) 0.02 % nebulizer solution Take 500 mcg by nebulization as needed for wheezing.       . lamoTRIgine (LAMICTAL) 100 MG tablet Take 100 mg by mouth daily.      . Magnesium Hydroxide (MAGNESIA PO) Take 1 tablet by mouth daily.      . montelukast (SINGULAIR) 10 MG tablet Take 10 mg by mouth at bedtime.      . NON FORMULARY 1 tablet daily. SAMe      . Probiotic Product (ALIGN PO) Take 1 tablet by mouth daily.      Marland Kitchen UNABLE TO FIND Estradiol 0. 2% three times a week Estrogen testosteron .5/.75 three times a week Progesterone cream 2% - topically on day 7-28 of cycle       No current facility-administered medications for this visit.      No Known Allergies    Family History  Problem Relation Age of Onset  . Heart disease Mother   . Heart disease Father   . Heart disease Sister   . Heart disease Brother   . Emphysema Father   . Emphysema Mother   . Asthma Mother   . Cancer Maternal Grandfather     leukemia  . Cancer Mother     squamous cell skin cancer    History   Social  History  . Marital Status: Married    Spouse Name: N/A    Number of Children: 0  . Years of Education: N/A   Occupational History  . homemaker    Social History Main Topics  . Smoking status: Never Smoker   . Smokeless tobacco: Never Used  . Alcohol Use: Yes     Comment: social  . Drug Use: No  . Sexual Activity: None   Other Topics Concern  . None   Social History Narrative  . None      REVIEW OF SYSTEMS - PERTINENT POSITIVES ONLY: Review of Systems - General ROS: negative for - chills, fever or weight loss Hematological and Lymphatic ROS: negative for - bleeding problems, blood clots or bruising Respiratory ROS: no cough, shortness of breath, or wheezing Cardiovascular ROS: no chest pain or dyspnea on exertion Gastrointestinal ROS: no abdominal pain, change in bowel habits, or black or bloody stools Genito-Urinary ROS: no dysuria, trouble voiding, or hematuria  EXAM: Filed Vitals:   09/25/12 1717  BP: 122/76  Pulse: 72  Temp: 97.5 F (36.4 C)  Resp: 16    General  appearance: alert and cooperative Resp: clear to auscultation bilaterally Cardio: regular rate and rhythm GI: normal findings: soft, non-tender   Procedure: Anoscopy Surgeon: Maisie Fus Diagnosis: concern for anal cancer, anal itching  Assistant:  After the risks and benefits were explained, verbal consent was obtained for above procedure  Anesthesia: none Findings: Mild perianal irritation.  No masses, no abnormal lesions    ASSESSMENT AND PLAN: Caitlyn COMMERFORD is a 47 y.o. F with a recent diagnosis of cervical cancer.  She is here to discuss her risk for anal cancer.  On exam she has no concerning lesion on anoscopy.  I do not think an anal pap would help in her situation.  I offered to perform a HRA with possible biopsies, but she does not want to do this at this time.  She has elected for repeat anoscopy in 6 months.  I have asked her to do an enema before her next visit.      Vanita Panda, MD Colon and Rectal Surgery / General Surgery [facility]      Visit Diagnoses: 1. Anal itching     Primary Care Physician: Garlan Fillers, MD

## 2012-09-27 ENCOUNTER — Ambulatory Visit (INDEPENDENT_AMBULATORY_CARE_PROVIDER_SITE_OTHER): Payer: BC Managed Care – PPO | Admitting: General Surgery

## 2012-09-27 ENCOUNTER — Ambulatory Visit: Payer: 59 | Admitting: Gynecologic Oncology

## 2012-10-02 DIAGNOSIS — N871 Moderate cervical dysplasia: Secondary | ICD-10-CM | POA: Insufficient documentation

## 2012-10-17 ENCOUNTER — Other Ambulatory Visit: Payer: Self-pay

## 2012-10-18 ENCOUNTER — Other Ambulatory Visit: Payer: Self-pay | Admitting: Obstetrics and Gynecology

## 2012-10-18 ENCOUNTER — Other Ambulatory Visit: Payer: Self-pay

## 2012-10-18 DIAGNOSIS — N644 Mastodynia: Secondary | ICD-10-CM

## 2012-11-01 ENCOUNTER — Other Ambulatory Visit: Payer: Self-pay | Admitting: Obstetrics and Gynecology

## 2012-11-01 ENCOUNTER — Ambulatory Visit
Admission: RE | Admit: 2012-11-01 | Discharge: 2012-11-01 | Disposition: A | Discharge status: Home / Self Care | Payer: BC Managed Care – PPO | Source: Ambulatory Visit | Attending: Obstetrics and Gynecology | Admitting: Obstetrics and Gynecology

## 2012-11-01 DIAGNOSIS — N644 Mastodynia: Secondary | ICD-10-CM

## 2012-11-15 ENCOUNTER — Other Ambulatory Visit: Payer: Self-pay | Admitting: Obstetrics and Gynecology

## 2012-11-15 ENCOUNTER — Ambulatory Visit
Admission: RE | Admit: 2012-11-15 | Discharge: 2012-11-15 | Disposition: A | Discharge status: Home / Self Care | Payer: BC Managed Care – PPO | Source: Ambulatory Visit | Attending: Obstetrics and Gynecology | Admitting: Obstetrics and Gynecology

## 2012-11-15 DIAGNOSIS — N644 Mastodynia: Secondary | ICD-10-CM

## 2012-11-23 ENCOUNTER — Encounter (HOSPITAL_COMMUNITY)
Admission: EM | Disposition: A | Discharge status: Home / Self Care | Payer: Self-pay | Source: Home / Self Care | Attending: Emergency Medicine

## 2012-11-23 ENCOUNTER — Ambulatory Visit
Admission: RE | Admit: 2012-11-23 | Discharge: 2012-11-23 | Disposition: A | Discharge status: Home / Self Care | Payer: BC Managed Care – PPO | Source: Ambulatory Visit | Attending: Internal Medicine | Admitting: Internal Medicine

## 2012-11-23 ENCOUNTER — Encounter (HOSPITAL_COMMUNITY): Payer: BC Managed Care – PPO | Admitting: Anesthesiology

## 2012-11-23 ENCOUNTER — Ambulatory Visit (HOSPITAL_COMMUNITY)
Admission: EM | Admit: 2012-11-23 | Discharge: 2012-11-24 | Disposition: A | Discharge status: Home / Self Care | Payer: BC Managed Care – PPO | Attending: Emergency Medicine | Admitting: Emergency Medicine

## 2012-11-23 ENCOUNTER — Other Ambulatory Visit: Payer: Self-pay | Admitting: Internal Medicine

## 2012-11-23 ENCOUNTER — Encounter (HOSPITAL_COMMUNITY): Payer: Self-pay | Admitting: Emergency Medicine

## 2012-11-23 ENCOUNTER — Emergency Department (HOSPITAL_COMMUNITY): Payer: BC Managed Care – PPO | Admitting: Anesthesiology

## 2012-11-23 DIAGNOSIS — K3580 Unspecified acute appendicitis: Secondary | ICD-10-CM

## 2012-11-23 DIAGNOSIS — Z79899 Other long term (current) drug therapy: Secondary | ICD-10-CM | POA: Insufficient documentation

## 2012-11-23 DIAGNOSIS — R109 Unspecified abdominal pain: Secondary | ICD-10-CM

## 2012-11-23 DIAGNOSIS — K589 Irritable bowel syndrome without diarrhea: Secondary | ICD-10-CM | POA: Insufficient documentation

## 2012-11-23 DIAGNOSIS — D259 Leiomyoma of uterus, unspecified: Secondary | ICD-10-CM | POA: Insufficient documentation

## 2012-11-23 DIAGNOSIS — J45909 Unspecified asthma, uncomplicated: Secondary | ICD-10-CM | POA: Insufficient documentation

## 2012-11-23 HISTORY — PX: LAPAROSCOPIC APPENDECTOMY: SHX408

## 2012-11-23 LAB — COMPREHENSIVE METABOLIC PANEL
AST: 23 U/L (ref 0–37)
Albumin: 4.2 g/dL (ref 3.5–5.2)
Calcium: 9.3 mg/dL (ref 8.4–10.5)
Creatinine, Ser: 0.99 mg/dL (ref 0.50–1.10)
Sodium: 138 mEq/L (ref 135–145)

## 2012-11-23 LAB — CBC WITH DIFFERENTIAL/PLATELET
Eosinophils Absolute: 0.1 10*3/uL (ref 0.0–0.7)
Eosinophils Relative: 1 % (ref 0–5)
HCT: 39.5 % (ref 36.0–46.0)
Hemoglobin: 13.5 g/dL (ref 12.0–15.0)
Lymphs Abs: 1.4 10*3/uL (ref 0.7–4.0)
MCH: 30.8 pg (ref 26.0–34.0)
MCV: 90.2 fL (ref 78.0–100.0)
Monocytes Absolute: 0.6 10*3/uL (ref 0.1–1.0)
Monocytes Relative: 6 % (ref 3–12)
RBC: 4.38 MIL/uL (ref 3.87–5.11)

## 2012-11-23 SURGERY — APPENDECTOMY, LAPAROSCOPIC
Anesthesia: General | Site: Abdomen | Wound class: Contaminated

## 2012-11-23 MED ORDER — ALPRAZOLAM 0.5 MG PO TABS
0.5000 mg | ORAL_TABLET | ORAL | Status: DC | PRN
  Administered 2012-11-24: 0.5 mg via ORAL
  Filled 2012-11-23: qty 1

## 2012-11-23 MED ORDER — SUCCINYLCHOLINE CHLORIDE 20 MG/ML IJ SOLN
INTRAMUSCULAR | Status: DC | PRN
  Administered 2012-11-23: 100 mg via INTRAVENOUS

## 2012-11-23 MED ORDER — ONDANSETRON HCL 4 MG/2ML IJ SOLN: 4.0000 mg | INTRAMUSCULAR | Status: DC | PRN

## 2012-11-23 MED ORDER — MIDAZOLAM HCL 5 MG/5ML IJ SOLN
INTRAMUSCULAR | Status: DC | PRN
  Administered 2012-11-23: 2 mg via INTRAVENOUS

## 2012-11-23 MED ORDER — KCL-LACTATED RINGERS-D5W 20 MEQ/L IV SOLN
INTRAVENOUS | Status: DC
  Administered 2012-11-24: 04:00:00 via INTRAVENOUS
  Filled 2012-11-23 (×2): qty 1000

## 2012-11-23 MED ORDER — LACTATED RINGERS IR SOLN
Status: DC | PRN
  Administered 2012-11-23: 2000 mL

## 2012-11-23 MED ORDER — PROPOFOL 10 MG/ML IV BOLUS
INTRAVENOUS | Status: DC | PRN
  Administered 2012-11-23: 150 mg via INTRAVENOUS

## 2012-11-23 MED ORDER — 0.9 % SODIUM CHLORIDE (POUR BTL) OPTIME
TOPICAL | Status: DC | PRN
  Administered 2012-11-23: 1000 mL

## 2012-11-23 MED ORDER — GLYCOPYRROLATE 0.2 MG/ML IJ SOLN
INTRAMUSCULAR | Status: DC | PRN
  Administered 2012-11-23: .7 mg via INTRAVENOUS

## 2012-11-23 MED ORDER — NEOSTIGMINE METHYLSULFATE 1 MG/ML IJ SOLN
INTRAMUSCULAR | Status: DC | PRN
  Administered 2012-11-23: 4 mg via INTRAVENOUS

## 2012-11-23 MED ORDER — FENTANYL CITRATE 0.05 MG/ML IJ SOLN
INTRAMUSCULAR | Status: DC | PRN
  Administered 2012-11-23 (×3): 50 ug via INTRAVENOUS
  Administered 2012-11-23: 100 ug via INTRAVENOUS

## 2012-11-23 MED ORDER — ROCURONIUM BROMIDE 100 MG/10ML IV SOLN
INTRAVENOUS | Status: DC | PRN
  Administered 2012-11-23: 5 mg via INTRAVENOUS
  Administered 2012-11-23: 25 mg via INTRAVENOUS

## 2012-11-23 MED ORDER — LACTATED RINGERS IV SOLN
INTRAVENOUS | Status: DC | PRN
  Administered 2012-11-23 (×2): via INTRAVENOUS

## 2012-11-23 MED ORDER — SODIUM CHLORIDE 0.9 % IV SOLN
3.0000 g | Freq: Once | INTRAVENOUS | Status: AC
  Administered 2012-11-23: 3 g via INTRAVENOUS
  Filled 2012-11-23: qty 3

## 2012-11-23 MED ORDER — HYDROMORPHONE HCL PF 1 MG/ML IJ SOLN: 0.2500 mg | INTRAMUSCULAR | Status: DC | PRN

## 2012-11-23 MED ORDER — HYDROMORPHONE HCL PF 1 MG/ML IJ SOLN
INTRAMUSCULAR | Status: DC | PRN
  Administered 2012-11-23 (×2): 0.5 mg via INTRAVENOUS

## 2012-11-23 MED ORDER — BUPIVACAINE HCL (PF) 0.5 % IJ SOLN
INTRAMUSCULAR | Status: DC | PRN
  Administered 2012-11-23: 8 mL

## 2012-11-23 MED ORDER — BUPIVACAINE HCL 0.5 % IJ SOLN
INTRAMUSCULAR | Status: AC
  Filled 2012-11-23: qty 1

## 2012-11-23 MED ORDER — ALBUTEROL SULFATE HFA 108 (90 BASE) MCG/ACT IN AERS
2.0000 | INHALATION_SPRAY | Freq: Four times a day (QID) | RESPIRATORY_TRACT | Status: DC | PRN
  Filled 2012-11-23: qty 6.7

## 2012-11-23 MED ORDER — DEXAMETHASONE SODIUM PHOSPHATE 10 MG/ML IJ SOLN
INTRAMUSCULAR | Status: DC | PRN
  Administered 2012-11-23: 10 mg via INTRAVENOUS

## 2012-11-23 MED ORDER — ONDANSETRON HCL 4 MG PO TABS: 4.0000 mg | ORAL_TABLET | Freq: Four times a day (QID) | ORAL | Status: DC | PRN

## 2012-11-23 MED ORDER — HYDROCODONE-ACETAMINOPHEN 5-325 MG PO TABS: 1.0000 | ORAL_TABLET | ORAL | Status: DC | PRN

## 2012-11-23 MED ORDER — SODIUM CHLORIDE 0.9 % IV SOLN
3.0000 g | Freq: Four times a day (QID) | INTRAVENOUS | Status: DC
  Administered 2012-11-24 (×2): 3 g via INTRAVENOUS
  Filled 2012-11-23 (×3): qty 3

## 2012-11-23 MED ORDER — MORPHINE SULFATE 2 MG/ML IJ SOLN: 2.0000 mg | INTRAMUSCULAR | Status: DC | PRN

## 2012-11-23 MED ORDER — LACTATED RINGERS IV SOLN: INTRAVENOUS | Status: DC

## 2012-11-23 MED ORDER — LIDOCAINE HCL (CARDIAC) 20 MG/ML IV SOLN
INTRAVENOUS | Status: DC | PRN
  Administered 2012-11-23: 50 mg via INTRAVENOUS

## 2012-11-23 MED ORDER — ONDANSETRON HCL 4 MG/2ML IJ SOLN
INTRAMUSCULAR | Status: DC | PRN
  Administered 2012-11-23: 4 mg via INTRAVENOUS

## 2012-11-23 SURGICAL SUPPLY — 42 items
APPLIER CLIP 5 13 M/L LIGAMAX5 (MISCELLANEOUS) ×4
APPLIER CLIP ROT 10 11.4 M/L (STAPLE)
BENZOIN TINCTURE PRP APPL 2/3 (GAUZE/BANDAGES/DRESSINGS) ×2 IMPLANT
CANISTER SUCTION 2500CC (MISCELLANEOUS) ×2 IMPLANT
CHLORAPREP W/TINT 26ML (MISCELLANEOUS) ×2 IMPLANT
CLIP APPLIE 5 13 M/L LIGAMAX5 (MISCELLANEOUS) ×2 IMPLANT
CLIP APPLIE ROT 10 11.4 M/L (STAPLE) IMPLANT
CUTTER FLEX LINEAR 45M (STAPLE) ×2 IMPLANT
DECANTER SPIKE VIAL GLASS SM (MISCELLANEOUS) ×2 IMPLANT
DRAPE LAPAROSCOPIC ABDOMINAL (DRAPES) ×2 IMPLANT
DRAPE UTILITY XL STRL (DRAPES) ×2 IMPLANT
DRSG TEGADERM 2-3/8X2-3/4 SM (GAUZE/BANDAGES/DRESSINGS) ×6 IMPLANT
ELECT REM PT RETURN 9FT ADLT (ELECTROSURGICAL) ×2
ELECTRODE REM PT RTRN 9FT ADLT (ELECTROSURGICAL) ×1 IMPLANT
ENDOLOOP SUT PDS II  0 18 (SUTURE)
ENDOLOOP SUT PDS II 0 18 (SUTURE) IMPLANT
GLOVE ECLIPSE 8.0 STRL XLNG CF (GLOVE) ×2 IMPLANT
GLOVE INDICATOR 8.0 STRL GRN (GLOVE) ×2 IMPLANT
GLOVE SURG SS PI 8.5 STRL IVOR (GLOVE) ×2
GLOVE SURG SS PI 8.5 STRL STRW (GLOVE) ×2 IMPLANT
GOWN PREVENTION PLUS LG XLONG (DISPOSABLE) IMPLANT
GOWN STRL REIN 2XL XLG LVL4 (GOWN DISPOSABLE) ×2 IMPLANT
GOWN STRL REIN XL XLG (GOWN DISPOSABLE) ×4 IMPLANT
IV LACTATED RINGERS 1000ML (IV SOLUTION) ×4 IMPLANT
KIT BASIN OR (CUSTOM PROCEDURE TRAY) ×2 IMPLANT
NS IRRIG 1000ML POUR BTL (IV SOLUTION) ×2 IMPLANT
PENCIL BUTTON HOLSTER BLD 10FT (ELECTRODE) ×2 IMPLANT
POUCH SPECIMEN RETRIEVAL 10MM (ENDOMECHANICALS) ×2 IMPLANT
RELOAD 45 VASCULAR/THIN (ENDOMECHANICALS) IMPLANT
RELOAD STAPLE TA45 3.5 REG BLU (ENDOMECHANICALS) ×2 IMPLANT
SCALPEL HARMONIC ACE (MISCELLANEOUS) ×2 IMPLANT
SET IRRIG TUBING LAPAROSCOPIC (IRRIGATION / IRRIGATOR) ×2 IMPLANT
SOLUTION ANTI FOG 6CC (MISCELLANEOUS) ×2 IMPLANT
STRIP CLOSURE SKIN 1/2X4 (GAUZE/BANDAGES/DRESSINGS) ×2 IMPLANT
SUT MNCRL AB 4-0 PS2 18 (SUTURE) ×2 IMPLANT
TOWEL OR 17X26 10 PK STRL BLUE (TOWEL DISPOSABLE) ×2 IMPLANT
TOWEL OR NON WOVEN STRL DISP B (DISPOSABLE) ×2 IMPLANT
TRAY FOLEY CATH 14FRSI W/METER (CATHETERS) ×2 IMPLANT
TRAY LAP CHOLE (CUSTOM PROCEDURE TRAY) ×2 IMPLANT
TROCAR BLADELESS OPT 5 75 (ENDOMECHANICALS) ×4 IMPLANT
TROCAR XCEL BLUNT TIP 100MML (ENDOMECHANICALS) ×2 IMPLANT
TUBING INSUFFLATION 10FT LAP (TUBING) ×2 IMPLANT

## 2012-11-23 NOTE — Transfer of Care (Signed)
Immediate Anesthesia Transfer of Care Note  Patient: Caitlyn Pruitt  Procedure(s) Performed: Procedure(s) (LRB): APPENDECTOMY LAPAROSCOPIC (N/A)  Patient Location: PACU  Anesthesia Type: General  Level of Consciousness: sedated, patient cooperative and responds to stimulation  Airway & Oxygen Therapy: Patient Spontanous Breathing and Patient connected to face mask oxgen  Post-op Assessment: Report given to PACU RN and Post -op Vital signs reviewed and stable  Post vital signs: Reviewed and stable  Complications: No apparent anesthesia complications

## 2012-11-23 NOTE — H&P (Signed)
Caitlyn Pruitt is an 47 y.o. female.   Chief Complaint: Worsening right lower quadrant pain HPI:   This is a 47 year old female with a one and a half to 2 day history of worsening abdominal pain. She had some cramping. Umbilical pain. The pain eventually the radiated to her right lower quadrant and worsened. No fever or chills but she's had some nausea. No diarrhea. No dysuria. She went and saw her primary care physician. She was sent for a CT scan which demonstrated findings consistent with acute appendicitis. No perforation. No abscess. She subsequently was sent to the emergency department and I was asked to see her.  Past Medical History  Diagnosis Date  . Fibroids   . Irritable bowel syndrome (IBS)   . Asthma 02/19/2012    Cervical high grade dysplasia  Past Surgical History  Procedure Laterality Date  . Turbinate reconstruction    . Right shoulder decompression    . Cesarean section    . Cervical cone biopsy    . Abdominoplasty      Family History  Problem Relation Age of Onset  . Heart disease Mother   . Heart disease Father   . Heart disease Sister   . Heart disease Brother   . Emphysema Father   . Emphysema Mother   . Asthma Mother   . Cancer Maternal Grandfather     leukemia  . Cancer Mother     squamous cell skin cancer   Social History:  reports that she has never smoked. She has never used smokeless tobacco. She reports that she drinks alcohol. She reports that she does not use illicit drugs.  Allergies: No Known Allergies   Prior to Admission medications   Medication Sig Start Date End Date Taking? Authorizing Provider  albuterol (PROVENTIL HFA;VENTOLIN HFA) 108 (90 BASE) MCG/ACT inhaler Inhale 2 puffs into the lungs every 6 (six) hours as needed for wheezing. Use with spacer 01/07/12  Yes Thao P Le, DO  ALPRAZolam (XANAX) 0.5 MG tablet Take 0.5 mg by mouth as needed for anxiety or sleep.   Yes Historical Provider, MD  conjugated estrogens (PREMARIN) vaginal  cream Place 0.5 Applicatorfuls vaginally See admin instructions. 3 times a week   Yes Historical Provider, MD  Cyanocobalamin 5000 MCG SUBL Place 5,000 mcg under the tongue daily.   Yes Historical Provider, MD  ergocalciferol (VITAMIN D2) 50000 UNITS capsule Take 50,000 Units by mouth once a week.   Yes Historical Provider, MD  fluticasone (FLONASE) 50 MCG/ACT nasal spray Place 1 spray into the nose daily.    Yes Historical Provider, MD  fluticasone (FLOVENT HFA) 110 MCG/ACT inhaler Inhale 2 puffs into the lungs daily as needed (asthma).    Yes Historical Provider, MD  FOLIC ACID PO Place 1 tablet under the tongue daily.   Yes Historical Provider, MD  Linaclotide (LINZESS PO) Take 1 tablet by mouth as needed (constipation).   Yes Historical Provider, MD  magnesium aspartate (MAGINEX) 615 MG tablet Take 615 mg by mouth 2 (two) times daily.   Yes Historical Provider, MD  Magnesium Hydroxide (MAGNESIA PO) Take 800 mg by mouth daily.    Yes Historical Provider, MD  montelukast (SINGULAIR) 10 MG tablet Take 10 mg by mouth at bedtime.   Yes Historical Provider, MD  Probiotic Product (ALIGN PO) Take 1 tablet by mouth daily.   Yes Historical Provider, MD  UNABLE TO FIND Estrogen testosteron .5/.75 three times a week Progesterone cream 2% - topically on day 7-28  of cycle   Yes Historical Provider, MD     Results for orders placed during the hospital encounter of 11/23/12 (from the past 48 hour(s))  COMPREHENSIVE METABOLIC PANEL     Status: Abnormal   Collection Time    11/23/12  5:30 PM      Result Value Range   Sodium 138  135 - 145 mEq/L   Potassium 3.6  3.5 - 5.1 mEq/L   Chloride 100  96 - 112 mEq/L   CO2 26  19 - 32 mEq/L   Glucose, Bld 98  70 - 99 mg/dL   BUN 7  6 - 23 mg/dL   Creatinine, Ser 9.14  0.50 - 1.10 mg/dL   Calcium 9.3  8.4 - 78.2 mg/dL   Total Protein 7.1  6.0 - 8.3 g/dL   Albumin 4.2  3.5 - 5.2 g/dL   AST 23  0 - 37 U/L   ALT 15  0 - 35 U/L   Alkaline Phosphatase 55  39 -  117 U/L   Total Bilirubin 0.8  0.3 - 1.2 mg/dL   GFR calc non Af Amer 67 (*) >90 mL/min   GFR calc Af Amer 77 (*) >90 mL/min   Comment: (NOTE)     The eGFR has been calculated using the CKD EPI equation.     This calculation has not been validated in all clinical situations.     eGFR's persistently <90 mL/min signify possible Chronic Kidney     Disease.  CBC WITH DIFFERENTIAL     Status: Abnormal   Collection Time    11/23/12  5:30 PM      Result Value Range   WBC 9.4  4.0 - 10.5 K/uL   RBC 4.38  3.87 - 5.11 MIL/uL   Hemoglobin 13.5  12.0 - 15.0 g/dL   HCT 95.6  21.3 - 08.6 %   MCV 90.2  78.0 - 100.0 fL   MCH 30.8  26.0 - 34.0 pg   MCHC 34.2  30.0 - 36.0 g/dL   RDW 57.8  46.9 - 62.9 %   Platelets 244  150 - 400 K/uL   Neutrophils Relative % 79 (*) 43 - 77 %   Neutro Abs 7.4  1.7 - 7.7 K/uL   Lymphocytes Relative 15  12 - 46 %   Lymphs Abs 1.4  0.7 - 4.0 K/uL   Monocytes Relative 6  3 - 12 %   Monocytes Absolute 0.6  0.1 - 1.0 K/uL   Eosinophils Relative 1  0 - 5 %   Eosinophils Absolute 0.1  0.0 - 0.7 K/uL   Basophils Relative 0  0 - 1 %   Basophils Absolute 0.0  0.0 - 0.1 K/uL   No results found.  Review of Systems  Constitutional: Negative for fever, chills and weight loss.  HENT: Negative for congestion and sore throat.   Respiratory: Negative.   Cardiovascular: Negative.   Gastrointestinal: Positive for nausea and abdominal pain. Negative for vomiting and diarrhea.  Genitourinary: Negative for dysuria and hematuria.  Neurological: Negative for dizziness and seizures.  Endo/Heme/Allergies: Does not bruise/bleed easily.    Blood pressure 133/71, pulse 67, temperature 99 F (37.2 C), temperature source Oral, resp. rate 16, SpO2 99.00%. Physical Exam  Constitutional: She appears well-developed and well-nourished. No distress.  HENT:  Head: Normocephalic and atraumatic.  Eyes: No scleral icterus.  Neck: Neck supple.  Cardiovascular: Normal rate and regular rhythm.    Respiratory: Effort normal and breath  sounds normal.  GI: Soft. She exhibits no distension and no mass. There is tenderness (right lower quadrant). There is guarding.  Periumbilical and lower transverse scars.  Musculoskeletal: She exhibits no edema.  Lymphadenopathy:    She has no cervical adenopathy.  Neurological: She is alert.  Skin: Skin is warm and dry.  Psychiatric: She has a normal mood and affect. Her behavior is normal.     Assessment/Plan Acute appendicitis. No evidence of perforation abscess by CT report.  Plan: Laparoscopic possible open appendectomy.  I have discussed the procedure and risks of appendectomy. The risks include but are not limited to bleeding, infection, wound problems, anesthesia, injury to intra-abdominal organs, possibility of postoperative ileus. She seems to understand and agrees with the plan.  Lynea Rollison J 11/23/2012, 7:26 PM

## 2012-11-23 NOTE — Op Note (Signed)
Operative Note  Caitlyn Pruitt female 47 y.o. 11/23/2012  PREOPERATIVE DX:  Acute appendicitis  POSTOPERATIVE DX:  Same  PROCEDURE:  Laparoscopic appendectomy         Surgeon: Adolph Pollack   Assistants: none  Anesthesia: General endotracheal anesthesia  Indications: this is a 47 year old female who presented to her primary care physician today complaining of worsening right lower quadrant pain. She was sent for a CT scan which demonstrated findings consistent with acute appendicitis. She is now brought to the operating room for the above procedure.    Procedure Detail:  She is brought to the operating room placed supine on the operating table and a general anesthetic was given. A Foley catheter was inserted. The abdominal wall was widely sterilely prepped and draped.  Marcaine solution was infiltrated in the subumbilical region. A small subumbilical incision was made through the skin and subcutaneous tissue. An incision was made through the anterior and posterior fascia as well as the peritoneum entered the peritoneal cavity under direct vision. A pursestring suture of 0 Vicryl was placed around the edges of the fascia.  A Hassan trocar was introduced into the peritoneal cavity. A pneumoperitoneum was created. The laparoscope was introduced and there is no underlying organ injury or bleeding. A 5 mm trocar was placed in the left lower quadrant and a 5 mm trocar placed in the right upper quadrant. She was placed in the Trendelenburg position with the right side tilted up. The cecum was identified. This was rotated medially and a retrocecal appendix was noted with acute inflammatory changes. There is no abscess or perforation. Using Harmonic Scalpel, I freed up the retrocecal appendix from its lateral attachments. I then divided the mesoappendix close to the appendix down to the base of the cecum. Using an Endo GIA stapler the appendix was amputated off the cecum. The appendix was  placed in a retrieval bag and removed through the subumbilical incision.  A right lower quadrant area was copiously irrigated. There was bleeding from the staple line that was controlled with hemoclips. Once hemostasis was adequate, irrigation fluid was evacuated. Abdominal inspection demonstrated no evidence of bleeding or organ injury. The subumbilical trocar was removed. The fascial defect was closed under laparoscopic vision by tightening up and tying down the pursestring suture. The remaining trocars were removed and the pneumoperitoneum was released.  Skin incisions were closed with 4-0 Monocryl subcuticular stitches. Steri-Strips and sterile dressings were applied.  She tolerated the procedure without any apparent complications and was taken to the recovery room in satisfactory condition.    Findings:  Acute retrocecal appendicitis  Estimated Blood Loss:  150 ml         Drains: none  Blood Given: none          Specimens: appendix        Complications:  * No complications entered in OR log *         Disposition: PACU - hemodynamically stable.         Condition: stable

## 2012-11-23 NOTE — ED Notes (Signed)
PT complains of RUQ for past 2 days with nausea, no v/d. PT had abd CT with found acute appendicitis. Pt states LBM yesterday.

## 2012-11-23 NOTE — Anesthesia Preprocedure Evaluation (Addendum)
Anesthesia Evaluation  Patient identified by MRN, date of birth, ID band Patient awake    Reviewed: Allergy & Precautions, H&P , NPO status , Patient's Chart, lab work & pertinent test results  Airway Mallampati: II TM Distance: >3 FB Neck ROM: full    Dental no notable dental hx. (+) Teeth Intact and Dental Advisory Given   Pulmonary neg pulmonary ROS, asthma ,  breath sounds clear to auscultation  Pulmonary exam normal       Cardiovascular Exercise Tolerance: Good negative cardio ROS  Rhythm:regular Rate:Normal     Neuro/Psych negative neurological ROS  negative psych ROS   GI/Hepatic negative GI ROS, Neg liver ROS,   Endo/Other  negative endocrine ROS  Renal/GU negative Renal ROS  negative genitourinary   Musculoskeletal   Abdominal   Peds  Hematology negative hematology ROS (+)   Anesthesia Other Findings   Reproductive/Obstetrics negative OB ROS                          Anesthesia Physical Anesthesia Plan  ASA: II and emergent  Anesthesia Plan: General   Post-op Pain Management:    Induction: Intravenous, Rapid sequence and Cricoid pressure planned  Airway Management Planned: Oral ETT  Additional Equipment:   Intra-op Plan:   Post-operative Plan: Extubation in OR  Informed Consent: I have reviewed the patients History and Physical, chart, labs and discussed the procedure including the risks, benefits and alternatives for the proposed anesthesia with the patient or authorized representative who has indicated his/her understanding and acceptance.   Dental Advisory Given  Plan Discussed with: CRNA and Surgeon  Anesthesia Plan Comments:         Anesthesia Quick Evaluation

## 2012-11-23 NOTE — ED Provider Notes (Signed)
  Medical screening examination/treatment/procedure(s) were performed by non-physician practitioner and as supervising physician I was immediately available for consultation/collaboration.     Gerhard Munch, MD 11/23/12 2011

## 2012-11-23 NOTE — ED Provider Notes (Signed)
CSN: [id number]     Arrival date & time 11/23/12  1710 History   First MD Initiated Contact with Patient 11/23/12 1715     Chief Complaint  Patient presents with  . Abdominal Pain   (Consider location/radiation/quality/duration/timing/severity/associated sxs/prior Treatment) HPI Comments: Patient is a 47 year old female with a past medical history of irritable bowel syndrome, asthma and fibroids who presents to the emergency department from [facility] after having a CT scan of her abdomen showing acute appendicitis. Patient states she has had right-sided abdominal pain over the past 2 days with associated nausea, no appetite as of today. Pain sharp, constant, 10/10. She went to her primary care physician earlier today and was sent for a CT scan. Denies fever, chills, urinary or bowel changes. Last bowel movement was yesterday.  Patient is a 47 y.o. female presenting with abdominal pain. The history is provided by the patient.  Abdominal Pain Associated symptoms: nausea     Past Medical History  Diagnosis Date  . Fibroids   . Irritable bowel syndrome (IBS)   . Asthma 02/19/2012   Past Surgical History  Procedure Laterality Date  . Turbinate reconstruction    . Right shoulder decompression    . Cesarean section    . Cervical cone biopsy    . Abdominoplasty     Family History  Problem Relation Age of Onset  . Heart disease Mother   . Heart disease Father   . Heart disease Sister   . Heart disease Brother   . Emphysema Father   . Emphysema Mother   . Asthma Mother   . Cancer Maternal Grandfather     leukemia  . Cancer Mother     squamous cell skin cancer   History  Substance Use Topics  . Smoking status: Never Smoker   . Smokeless tobacco: Never Used  . Alcohol Use: Yes     Comment: social   OB History   Grav Para Term Preterm Abortions TAB SAB Ect Mult Living                 Review of Systems  Constitutional: Positive for appetite change.  Gastrointestinal:  Positive for nausea and abdominal pain.  All other systems reviewed and are negative.    Allergies  Review of patient's allergies indicates no known allergies.  Home Medications   Current Outpatient Rx  Name  Route  Sig  Dispense  Refill  . albuterol (PROVENTIL HFA;VENTOLIN HFA) 108 (90 BASE) MCG/ACT inhaler   Inhalation   Inhale 2 puffs into the lungs every 6 (six) hours as needed for wheezing. Use with spacer   1 Inhaler   0   . albuterol (PROVENTIL) (2.5 MG/3ML) 0.083% nebulizer solution      USE VIA NEBULIZER EVERY 6 HOURS AS NEEDED FOR WHEEZING   75 mL   4   . ALPRAZolam (XANAX) 0.25 MG tablet   Oral   Take 0.125 mg by mouth at bedtime as needed.         . Azelaic Acid (FINACEA) 15 % cream   Topical   Apply 1 application topically daily. After skin is thoroughly washed and patted dry, gently but thoroughly massage a thin film of azelaic acid cream into the affected area twice daily, in the morning and evening.         . budesonide (PULMICORT) 0.5 MG/2ML nebulizer solution   Nebulization   Take 0.5 mg by nebulization as needed.         Marland Kitchen  Cyanocobalamin (VITAMIN B 12 PO)   Oral   Take 1 tablet by mouth daily.         . ergocalciferol (VITAMIN D2) 50000 UNITS capsule   Oral   Take 50,000 Units by mouth once a week.         . fluticasone (FLONASE) 50 MCG/ACT nasal spray   Nasal   Place 2 sprays into the nose daily.         . fluticasone (FLOVENT HFA) 110 MCG/ACT inhaler   Inhalation   Inhale 1 puff into the lungs 2 (two) times daily.         Marland Kitchen ipratropium (ATROVENT) 0.02 % nebulizer solution   Nebulization   Take 500 mcg by nebulization as needed for wheezing.         . lamoTRIgine (LAMICTAL) 100 MG tablet   Oral   Take 100 mg by mouth daily.         . Magnesium Hydroxide (MAGNESIA PO)   Oral   Take 1 tablet by mouth daily.         . montelukast (SINGULAIR) 10 MG tablet   Oral   Take 10 mg by mouth at bedtime.         .  NON FORMULARY      1 tablet daily. SAMe         . Probiotic Product (ALIGN PO)   Oral   Take 1 tablet by mouth daily.         Marland Kitchen UNABLE TO FIND      Estradiol 0. 2% three times a week Estrogen testosteron .5/.75 three times a week Progesterone cream 2% - topically on day 7-28 of cycle          BP 133/71  Pulse 67  Temp(Src) 99 F (37.2 C) (Oral)  Resp 16  SpO2 99% Physical Exam  Nursing note and vitals reviewed. Constitutional: She is oriented to person, place, and time. She appears well-developed and well-nourished. No distress.  HENT:  Head: Normocephalic and atraumatic.  Mouth/Throat: Oropharynx is clear and moist.  Eyes: Conjunctivae are normal.  Neck: Normal range of motion. Neck supple.  Cardiovascular: Normal rate, regular rhythm and normal heart sounds.   Pulmonary/Chest: Effort normal and breath sounds normal.  Abdominal: Soft. Bowel sounds are normal. There is tenderness in the right upper quadrant and right lower quadrant. There is guarding. There is no rigidity.  Referred pain on palpation of left side of abdomen to right. No peritoneal signs.  Musculoskeletal: Normal range of motion. She exhibits no edema.  Neurological: She is alert and oriented to person, place, and time.  Skin: Skin is warm and dry. She is not diaphoretic.  Psychiatric: She has a normal mood and affect. Her behavior is normal.    ED Course  Procedures (including critical care time) Labs Review Labs Reviewed  COMPREHENSIVE METABOLIC PANEL - Abnormal; Notable for the following:    GFR calc non Af Amer 67 (*)    GFR calc Af Amer 77 (*)    All other components within normal limits  CBC WITH DIFFERENTIAL - Abnormal; Notable for the following:    Neutrophils Relative % 79 (*)    All other components within normal limits   Imaging Review No results found.  EKG Interpretation   None       MDM   1. Acute appendicitis     Patient with known acute appendicitis from CT scan.  Will have report from CT scan faxed  to ED, no report on CD images. She is well appearing in NAD, no peritoneal signs. Refusing pain medications at this time. Labs pending. Case discussed with attending Dr. Jeraldine Loots who agrees with plan of care. 6:55 PM Patient had a printed preliminary report from CT scan, official results from [place] will not be completed until tomorrow. Acute appendicitis without perforation or abscess. I spoke with Dr. Kae Heller with [facility] who will admit patient for surgery, 3g unasyn given. She remains in NAD, still refusing pain meds.  Trevor Mace, PA-C 11/23/12 [phone]

## 2012-11-23 NOTE — Anesthesia Postprocedure Evaluation (Signed)
  Anesthesia Post-op Note  Patient: Caitlyn Pruitt  Procedure(s) Performed: Procedure(s) (LRB): APPENDECTOMY LAPAROSCOPIC (N/A)  Patient Location: PACU  Anesthesia Type: General  Level of Consciousness: awake and alert   Airway and Oxygen Therapy: Patient Spontanous Breathing  Post-op Pain: mild  Post-op Assessment: Post-op Vital signs reviewed, Patient's Cardiovascular Status Stable, Respiratory Function Stable, Patent Airway and No signs of Nausea or vomiting  Last Vitals:  Filed Vitals:   11/23/12 2150  BP:   Pulse:   Temp: 36.9 C  Resp:     Post-op Vital Signs: stable   Complications: No apparent anesthesia complications

## 2012-11-24 ENCOUNTER — Encounter (HOSPITAL_COMMUNITY): Payer: Self-pay | Admitting: General Surgery

## 2012-11-24 MED ORDER — DOCUSATE SODIUM 100 MG PO CAPS
100.0000 mg | ORAL_CAPSULE | Freq: Two times a day (BID) | ORAL | Status: DC | PRN
  Administered 2012-11-24: 100 mg via ORAL
  Filled 2012-11-24: qty 1

## 2012-11-24 MED ORDER — DSS 100 MG PO CAPS: 100.0000 mg | ORAL_CAPSULE | Freq: Two times a day (BID) | ORAL | Status: DC | PRN

## 2012-11-24 MED ORDER — HYDROCODONE-ACETAMINOPHEN 5-325 MG PO TABS: 1.0000 | ORAL_TABLET | Freq: Four times a day (QID) | ORAL | Status: DC | PRN

## 2012-11-24 NOTE — Discharge Summary (Signed)
Cyrus Ramsburg M. Lidiya Reise, MD, FACS General, Bariatric, & Minimally Invasive Surgery [facility]  

## 2012-11-24 NOTE — Discharge Summary (Signed)
Physician Discharge Summary  Patient ID: Caitlyn Pruitt MRN: [id number] DOB/AGE: Mar 20, 1965 47 y.o.  Admit date: 11/23/2012 Discharge date: 11/24/2012  Admitting Diagnosis: Acute Appendicitis   Discharge Diagnosis Patient Active Problem List   Diagnosis Date Noted  . Appendicitis, acute 11/23/2012  . 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 Asthma in adult 02/19/2012  . Environmental allergies 02/19/2012    Consultants None  Imaging: No results found.  Procedures Dr. Avel Peace (11/23/12) - Laparoscopic appendectomy   Hospital Course:  Caitlyn Pruitt presented to [facility] with right-sided abdominal pain x 2 days with associated N/V and decreased appetite. [facility] PCP ordered CT which  showed acute appendicitis without perforation. No elevated WBC.  Patient was admitted and underwent procedure listed above.  Tolerated procedure well and was transferred to the floor.  Diet was advanced as tolerated.  On POD #1 the patient was voiding well, tolerating bland diet, ambulating well, pain well controlled, vital signs stable, incisions c/d/i and felt stable for discharge home.  Patient will follow up in our office in 4 weeks and knows to call with questions or concerns.  Physical Exam: General:  Alert, NAD, pleasant, comfortable Abd:  Soft, ND, mild diffuse abdominal tenderness, incisions C/D/I.    Medication List         albuterol 108 (90 BASE) MCG/ACT inhaler  Commonly known as:  PROVENTIL HFA;VENTOLIN HFA  Inhale 2 puffs into the lungs every 6 (six) hours as needed for wheezing. Use with spacer     ALIGN PO  Take 1 tablet by mouth daily.     ALPRAZolam 0.5 MG tablet  Commonly known as:  XANAX  Take 0.5 mg by mouth as needed for anxiety or sleep.     conjugated estrogens vaginal cream  Commonly known as:  PREMARIN  Place 0.5 Applicatorfuls vaginally See admin instructions. 3 times a week     Cyanocobalamin 5000 MCG Subl  Place 5,000 mcg  under the tongue daily.     DSS 100 MG Caps  Take 100 mg by mouth 2 (two) times daily as needed for mild constipation.     ergocalciferol 50000 UNITS capsule  Commonly known as:  VITAMIN D2  Take 50,000 Units by mouth once a week.     fluticasone 110 MCG/ACT inhaler  Commonly known as:  FLOVENT HFA  Inhale 2 puffs into the lungs daily as needed (asthma).     fluticasone 50 MCG/ACT nasal spray  Commonly known as:  FLONASE  Place 1 spray into the nose daily.     FOLIC ACID PO  Place 1 tablet under the tongue daily.     HYDROcodone-acetaminophen 5-325 MG per tablet  Commonly known as:  NORCO/VICODIN  Take 1-2 tablets by mouth every 6 (six) hours as needed for moderate pain.     LINZESS PO  Take 1 tablet by mouth as needed (constipation).     MAGNESIA PO  Take 800 mg by mouth daily.     magnesium aspartate 615 MG tablet  Commonly known as:  MAGINEX  Take 615 mg by mouth 2 (two) times daily.     montelukast 10 MG tablet  Commonly known as:  SINGULAIR  Take 10 mg by mouth at bedtime.     UNABLE TO FIND  - Estrogen testosteron .5/.75 three times a week  - Progesterone cream 2% - topically on day 7-28 of cycle             Follow-up Information  Follow up with [facility] [place] On 12/19/2012. (Your appointment is at 2:15pm, please arrive at least 30 minutes before your appointment to complete your check in paperwork.  If you are unable to arrive 30 minutes prior to your appointment time we may have to cancel or reschedule you.)    Contact information:   [address]   [place] [address] [phone]       Signed: Britt Bottom, PA-S [facility] [phone]  11/24/2012, 8:35 AM

## 2012-11-24 NOTE — Discharge Instructions (Signed)
Your appointment is at 2:15pm, please arrive at least 30 minutes before your appointment to complete your check in paperwork.  If you are unable to arrive 30 minutes prior to your appointment time we may have to cancel or reschedule you. ° °[facility] ______[facility] °LAPAROSCOPIC SURGERY: POST OP INSTRUCTIONS °Always review your discharge instruction sheet given to you by the facility where your surgery was performed. °IF YOU HAVE DISABILITY OR FAMILY LEAVE FORMS, YOU MUST BRING THEM TO THE OFFICE FOR PROCESSING.   °DO NOT GIVE THEM TO YOUR DOCTOR. ° °1. A prescription for pain medication may be given to you upon discharge.  Take your pain medication as prescribed, if needed.  If narcotic pain medicine is not needed, then you may take acetaminophen (Tylenol) or ibuprofen (Advil) as needed. °2. Take your usually prescribed medications unless otherwise directed. °3. If you need a refill on your pain medication, please contact your pharmacy.  They will contact our office to request authorization. Prescriptions will not be filled after 5pm or on week-ends. °4. You should follow a light diet the first few days after arrival home, such as soup and crackers, etc.  Be sure to include lots of fluids daily. °5. Most patients will experience some swelling and bruising in the area of the incisions.  Ice packs will help.  Swelling and bruising can take several days to resolve.  °6. It is common to experience some constipation if taking pain medication after surgery.  Increasing fluid intake and taking a stool softener (such as Colace) will usually help or prevent this problem from occurring.  A mild laxative (Milk of Magnesia or Miralax) should be taken according to package instructions if there are no bowel movements after 48 hours. °7. Unless discharge instructions indicate otherwise, you may remove your bandages 24-48 hours after surgery, and you may shower at that time.  You may have steri-strips (small skin  tapes) in place directly over the incision.  These strips should be left on the skin for 7-10 days.  If your surgeon used skin glue on the incision, you may shower in 24 hours.  The glue will flake off over the next 2-3 weeks.  Any sutures or staples will be removed at the office during your follow-up visit. °8. ACTIVITIES:  You may resume regular (light) daily activities beginning the next day--such as daily self-care, walking, climbing stairs--gradually increasing activities as tolerated.  You may have sexual intercourse when it is comfortable.  Refrain from any heavy lifting or straining until approved by your doctor. °a. You may drive when you are no longer taking prescription pain medication, you can comfortably wear a seatbelt, and you can safely maneuver your car and apply brakes. °b. RETURN TO WORK:  __________________________________________________________ °9. You should see your doctor in the office for a follow-up appointment approximately 2-3 weeks after your surgery.  Make sure that you call for this appointment within a day or two after you arrive home to insure a convenient appointment time. °10. OTHER INSTRUCTIONS: __________________________________________________________________________________________________________________________ __________________________________________________________________________________________________________________________ °WHEN TO CALL YOUR DOCTOR: °1. Fever over 101.0 °2. Inability to urinate °3. Continued bleeding from incision. °4. Increased pain, redness, or drainage from the incision. °5. Increasing abdominal pain ° °The clinic staff is available to answer your questions during regular business hours.  Please don’t hesitate to call and ask to speak to one of the nurses for clinical concerns.  If you have a medical emergency, go to the nearest emergency room or call 911.    A surgeon from [facility] is always on call at the hospital. [address], [place], [place], [place]  [address] ? [address] [place], [place], [place]   [address] [phone] ? [phone] ? FAX [phone] Web site: [email or web address]

## 2012-11-26 ENCOUNTER — Telehealth (INDEPENDENT_AMBULATORY_CARE_PROVIDER_SITE_OTHER): Payer: Self-pay | Admitting: General Surgery

## 2012-11-26 NOTE — Telephone Encounter (Signed)
This patient called and said that she had not moved her bowels since her surgery on Thursday.  She is taking some OTC meds now and not having any results with this.  She also was concerned with a "large thrombosed hemorrhoid" and was wanting treatment recommendations for this since she has required "banding" for this in the past.  I recommended that she drink plenty of fluids and continue to use OTC stool softeners and laxatives as tolerated for her bowels.  I recommended magnesium citrate for this.  Though she originally called about her bowels, she seemed more interested in treatment options for her hemorrhoids since she has had surgery for these in the past including PPH.  I explained that since the office was closed today, any examination or treatment would need to be through the ER but if this was truly hemorrhoid disease, this should improve as the bowels improve, although it might take some time.  She seemed insistent that she receive some type of "banding" for this problem, despite my reassurance that it should improve.  I explained again that if she wished to be evaluated for this, it would need to be by the ER over the weekend, or she could call the office tomorrow and try to be scheduled for an appointment if available.

## 2012-11-27 ENCOUNTER — Ambulatory Visit (INDEPENDENT_AMBULATORY_CARE_PROVIDER_SITE_OTHER): Payer: BC Managed Care – PPO | Admitting: Surgery

## 2012-11-27 ENCOUNTER — Encounter (INDEPENDENT_AMBULATORY_CARE_PROVIDER_SITE_OTHER): Payer: Self-pay | Admitting: Surgery

## 2012-11-27 ENCOUNTER — Telehealth (INDEPENDENT_AMBULATORY_CARE_PROVIDER_SITE_OTHER): Payer: Self-pay

## 2012-11-27 VITALS — BP 124/72 | HR 68 | Temp 98.6°F | Resp 14 | Ht 69.0 in | Wt 174.4 lb

## 2012-11-27 DIAGNOSIS — K645 Perianal venous thrombosis: Secondary | ICD-10-CM | POA: Insufficient documentation

## 2012-11-27 NOTE — Progress Notes (Signed)
Status post laparoscopic appendectomy on 11/23/12 by Dr. Avel Peace.  The patient developed some constipation and has a thrombosed external hemorrhoid. This has become very painful. She comes in today for evaluation.  Filed Vitals:   11/27/12 1557  BP: 124/72  Pulse: 68  Temp: 98.6 F (37 C)  Resp: 14   Her laparoscopic incisions are all well-healed with no sign of infection. No abdominal tenderness. She has a moderate sized thrombosed external hemorrhoid on the right. We anesthetized this with 1% lidocaine and incised this. We removed 2 fairly large clots with complete resolution of the hemorrhoid. She may treat this with a dry dressing and sitz baths until her symptoms resolve. Followup as needed.  Wilmon Arms. Corliss Skains, MD, [facility]  General/ Trauma Surgery  11/27/2012 4:31 PM

## 2012-11-27 NOTE — Telephone Encounter (Signed)
Patient states she has thromb hem per patient asking to be seen today urg appt. Today @ 345p

## 2012-12-19 ENCOUNTER — Encounter (INDEPENDENT_AMBULATORY_CARE_PROVIDER_SITE_OTHER): Payer: BC Managed Care – PPO

## 2012-12-30 ENCOUNTER — Encounter (HOSPITAL_COMMUNITY): Payer: Self-pay

## 2012-12-30 ENCOUNTER — Inpatient Hospital Stay (HOSPITAL_COMMUNITY)
Admission: AD | Admit: 2012-12-30 | Discharge: 2012-12-30 | Disposition: A | Discharge status: Home / Self Care | Payer: BC Managed Care – PPO | Source: Ambulatory Visit | Attending: Obstetrics and Gynecology | Admitting: Obstetrics and Gynecology

## 2012-12-30 ENCOUNTER — Inpatient Hospital Stay (HOSPITAL_COMMUNITY): Payer: BC Managed Care – PPO

## 2012-12-30 DIAGNOSIS — N879 Dysplasia of cervix uteri, unspecified: Secondary | ICD-10-CM | POA: Insufficient documentation

## 2012-12-30 DIAGNOSIS — N949 Unspecified condition associated with female genital organs and menstrual cycle: Secondary | ICD-10-CM | POA: Insufficient documentation

## 2012-12-30 DIAGNOSIS — M549 Dorsalgia, unspecified: Secondary | ICD-10-CM | POA: Insufficient documentation

## 2012-12-30 DIAGNOSIS — R1031 Right lower quadrant pain: Secondary | ICD-10-CM | POA: Insufficient documentation

## 2012-12-30 DIAGNOSIS — N83201 Unspecified ovarian cyst, right side: Secondary | ICD-10-CM

## 2012-12-30 DIAGNOSIS — N938 Other specified abnormal uterine and vaginal bleeding: Secondary | ICD-10-CM | POA: Insufficient documentation

## 2012-12-30 DIAGNOSIS — N83209 Unspecified ovarian cyst, unspecified side: Secondary | ICD-10-CM

## 2012-12-30 HISTORY — DX: Benign neoplasm of connective and other soft tissue, unspecified: D21.9

## 2012-12-30 HISTORY — DX: Unspecified abnormal cytological findings in specimens from cervix uteri: R87.619

## 2012-12-30 HISTORY — DX: Reserved for concepts with insufficient information to code with codable children: IMO0002

## 2012-12-30 LAB — CBC
HCT: 39.3 % (ref 36.0–46.0)
Hemoglobin: 13.5 g/dL (ref 12.0–15.0)
MCHC: 34.4 g/dL (ref 30.0–36.0)
Platelets: 226 10*3/uL (ref 150–400)
RBC: 4.45 MIL/uL (ref 3.87–5.11)
RDW: 13 % (ref 11.5–15.5)
WBC: 5.2 10*3/uL (ref 4.0–10.5)

## 2012-12-30 LAB — COMPREHENSIVE METABOLIC PANEL
ALT: 19 U/L (ref 0–35)
Alkaline Phosphatase: 48 U/L (ref 39–117)
BUN: 12 mg/dL (ref 6–23)
CO2: 25 mEq/L (ref 19–32)
Calcium: 9.1 mg/dL (ref 8.4–10.5)
Chloride: 102 mEq/L (ref 96–112)
GFR calc Af Amer: 84 mL/min — ABNORMAL LOW (ref >90)
GFR calc non Af Amer: 72 mL/min — ABNORMAL LOW (ref >90)
Glucose, Bld: 88 mg/dL (ref 70–99)
Potassium: 4.1 mEq/L (ref 3.5–5.1)
Sodium: 136 mEq/L (ref 135–145)
Total Bilirubin: 0.5 mg/dL (ref 0.3–1.2)

## 2012-12-30 LAB — URINALYSIS, ROUTINE W REFLEX MICROSCOPIC
Bilirubin Urine: NEGATIVE
Glucose, UA: NEGATIVE mg/dL
Hgb urine dipstick: NEGATIVE
Specific Gravity, Urine: <1.005 — ABNORMAL LOW (ref 1.005–1.030)
pH: 6 (ref 5.0–8.0)

## 2012-12-30 NOTE — MAU Note (Signed)
Pt complains of severe abdominal pain for 10 days. States "it feels like right ovary pain." Pt states she has ongoing gynecological issues with a recent cervical biopsy. Had appendectomy on 11/13.  Denies vaginal bleeding and discharge.

## 2012-12-30 NOTE — MAU Provider Note (Signed)
History     CSN: [id number]  Arrival date and time: 12/30/12 0865   None     Chief Complaint  Patient presents with  . Abdominal Pain   HPI  Caitlyn Pruitt is a 47 y.o. female G1P1001 who presents with right lower quadrant pain.  One week was seen in Dr. Lynnell Dike office for vaginal bleeding that was black following intercourse. 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 Pt recently had her appendix on Nov 13th; pt has been followed by the surgeon however, did not call the surgeon regarding this pain.  Pt presents today to with right sided, lower "ovary pain". Pain is sharp and constant. She is very convinced that she has an ovarian cyst and would like an [place] today to confirm.  Pt is scheduled to see oncologist on Jan 15th.  OB History   Grav Para Term Preterm Abortions TAB SAB Ect Mult Living   1 1 1       1       Past Medical History  Diagnosis Date  . Irritable bowel syndrome (IBS)   . Asthma 02/19/2012  . Abnormal Pap smear     Past Surgical History  Procedure Laterality Date  . Turbinate reconstruction    . Right shoulder decompression    . Cesarean section    . Cervical cone biopsy    . Abdominoplasty    . Laparoscopic appendectomy N/A 11/23/2012    Procedure: APPENDECTOMY LAPAROSCOPIC;  Surgeon: Adolph Pollack, MD;  Location: [facility];  Service: General;  Laterality: N/A;    Family History  Problem Relation Age of Onset  . Heart disease Mother   . Heart disease Father   . Heart disease Sister   . Heart disease Brother   . Emphysema Father   . Emphysema Mother   . Asthma Mother   . Cancer Maternal Grandfather     leukemia  . Cancer Mother     squamous cell skin cancer    History  Substance Use Topics  . Smoking status: Never Smoker   . Smokeless tobacco: Never Used  . Alcohol Use: Yes     Comment: social    Allergies: No Known  Allergies  Prescriptions prior to admission  Medication Sig Dispense Refill  . albuterol (PROVENTIL HFA;VENTOLIN HFA) 108 (90 BASE) MCG/ACT inhaler Inhale 2 puffs into the lungs every 6 (six) hours as needed for wheezing. Use with spacer  1 Inhaler  0  . ALPRAZolam (XANAX) 0.5 MG tablet Take 0.5 mg by mouth as needed for anxiety or sleep.      . Cholecalciferol (VITAMIN D3 MAXIMUM STRENGTH PO) Take 7,000 Units by mouth daily.      Marland Kitchen conjugated estrogens (PREMARIN) vaginal cream Place 0.5 Applicatorfuls vaginally See admin instructions. 3 times a week      . Cyanocobalamin 5000 MCG SUBL Place 5,000 mcg under the tongue daily.      Marland Kitchen docusate sodium 100 MG CAPS Take 100 mg by mouth 2 (two) times daily as needed for mild constipation.    0  . fluticasone (FLONASE) 50 MCG/ACT nasal spray Place 1 spray into the nose daily.       . fluticasone (FLOVENT HFA) 110 MCG/ACT inhaler Inhale 2 puffs into the lungs daily as needed (asthma).       . FOLIC ACID PO Place 1 tablet under the tongue daily.      Marland Kitchen  Linaclotide (LINZESS PO) Take 1 tablet by mouth as needed (constipation).      . magnesium aspartate (MAGINEX) 615 MG tablet Take 615 mg by mouth daily.       . montelukast (SINGULAIR) 10 MG tablet Take 10 mg by mouth at bedtime.      . Probiotic Product (ALIGN PO) Take 1 tablet by mouth daily.      Marland Kitchen UNABLE TO FIND Estrogen testosteron .5/.75 three times a week Progesterone cream 2% - topically on day 7-28 of cycle       Results for orders placed during the hospital encounter of 12/30/12 (from the past 24 hour(s))  URINALYSIS, ROUTINE W REFLEX MICROSCOPIC     Status: Abnormal   Collection Time    12/30/12  9:45 AM      Result Value Range   Color, Urine YELLOW  YELLOW   APPearance CLEAR  CLEAR   Specific Gravity, Urine <1.005 (*) 1.005 - 1.030   pH 6.0  5.0 - 8.0   Glucose, UA NEGATIVE  NEGATIVE mg/dL   Hgb urine dipstick NEGATIVE  NEGATIVE   Bilirubin Urine NEGATIVE  NEGATIVE   Ketones, ur  NEGATIVE  NEGATIVE mg/dL   Protein, ur NEGATIVE  NEGATIVE mg/dL   Urobilinogen, UA 0.2  0.0 - 1.0 mg/dL   Nitrite NEGATIVE  NEGATIVE   Leukocytes, UA NEGATIVE  NEGATIVE  POCT PREGNANCY, URINE     Status: None   Collection Time    12/30/12  9:57 AM      Result Value Range   Preg Test, Ur NEGATIVE  NEGATIVE  COMPREHENSIVE METABOLIC PANEL     Status: Abnormal   Collection Time    12/30/12 10:30 AM      Result Value Range   Sodium 136  135 - 145 mEq/L   Potassium 4.1  3.5 - 5.1 mEq/L   Chloride 102  96 - 112 mEq/L   CO2 25  19 - 32 mEq/L   Glucose, Bld 88  70 - 99 mg/dL   BUN 12  6 - 23 mg/dL   Creatinine, Ser 1.61  0.50 - 1.10 mg/dL   Calcium 9.1  8.4 - 09.6 mg/dL   Total Protein 6.7  6.0 - 8.3 g/dL   Albumin 4.1  3.5 - 5.2 g/dL   AST 34  0 - 37 U/L   ALT 19  0 - 35 U/L   Alkaline Phosphatase 48  39 - 117 U/L   Total Bilirubin 0.5  0.3 - 1.2 mg/dL   GFR calc non Af Amer 72 (*) >90 mL/min   GFR calc Af Amer 84 (*) >90 mL/min  CBC     Status: None   Collection Time    12/30/12 10:30 AM      Result Value Range   WBC 5.2  4.0 - 10.5 K/uL   RBC 4.45  3.87 - 5.11 MIL/uL   Hemoglobin 13.5  12.0 - 15.0 g/dL   HCT 04.5  40.9 - 81.1 %   MCV 88.3  78.0 - 100.0 fL   MCH 30.3  26.0 - 34.0 pg   MCHC 34.4  30.0 - 36.0 g/dL   RDW 91.4  78.2 - 95.6 %   Platelets 226  150 - 400 K/uL   US Transvaginal Non-ob  12/30/2012   CLINICAL DATA:  Right lower quadrant pain. Abnormal uterine bleeding.  EXAM: TRANSABDOMINAL ULTRASOUND OF PELVIS  TECHNIQUE: Transabdominal ultrasound examination of the pelvis was performed including evaluation of the uterus,  ovaries, adnexal regions, and pelvic cul-de-sac.  COMPARISON:  None.  FINDINGS: Uterus  Measurements: 8 mm x 42 mm x 52 mm. There is a posterior fundal fibroid that appears sub serosal measuring 16 mm x 15 mm x 17 mm.  Endometrium  Thickness: 13 mm, within normal limits for a woman of child-bearing years. No focal abnormality visualized.  Right ovary   Measurements: 36 mm x 21 mm x 33 mm. Clips luteum cyst is present measuring 2.3 cm.  Left ovary  Measurements: 22 mm x 17 mm x 22 mm. Normal appearance/no adnexal mass.  Other findings:  No free fluid  IMPRESSION: 1. No acute abnormality. 2. Fibroid uterus.   Electronically Signed   By: Andreas Newport M.D.   On: 12/30/2012 11:49   US Pelvis Complete  12/30/2012   CLINICAL DATA:  Right lower quadrant pain. Abnormal uterine bleeding.  EXAM: TRANSABDOMINAL ULTRASOUND OF PELVIS  TECHNIQUE: Transabdominal ultrasound examination of the pelvis was performed including evaluation of the uterus, ovaries, adnexal regions, and pelvic cul-de-sac.  COMPARISON:  None.  FINDINGS: Uterus  Measurements: 8 mm x 42 mm x 52 mm. There is a posterior fundal fibroid that appears sub serosal measuring 16 mm x 15 mm x 17 mm.  Endometrium  Thickness: 13 mm, within normal limits for a woman of child-bearing years. No focal abnormality visualized.  Right ovary  Measurements: 36 mm x 21 mm x 33 mm. Clips luteum cyst is present measuring 2.3 cm.  Left ovary  Measurements: 22 mm x 17 mm x 22 mm. Normal appearance/no adnexal mass.  Other findings:  No free fluid  IMPRESSION: 1. No acute abnormality. 2. Fibroid uterus.   Electronically Signed   By: Andreas Newport M.D.   On: 12/30/2012 11:49    Review of Systems  Gastrointestinal: Positive for abdominal pain.       Sharp, right lower quadrant pain that radiates to her back.  Musculoskeletal: Positive for back pain.   Physical Exam   Blood pressure 127/69, pulse 66, temperature 98.1 F (36.7 C), temperature source Oral, resp. rate 18, height 5\' 9"  (1.753 m), weight 77.111 kg (170 lb), last menstrual period 12/16/2012.  Physical Exam  Constitutional: She is oriented to person, place, and time. She appears well-developed and well-nourished. No distress.  HENT:  Head: Normocephalic.  Eyes: Pupils are equal, round, and reactive to light.  Neck: Neck supple.  Respiratory: Effort  normal.  GI: Soft. She exhibits no distension and no mass. There is tenderness in the right lower quadrant. There is no rigidity, no rebound, no guarding and no tenderness at McBurney's point.  Right lower quadrant tenderness on palpation  Musculoskeletal: Normal range of motion.  Neurological: She is alert and oriented to person, place, and time.  Skin: Skin is warm. She is not diaphoretic.  Psychiatric: Her mood appears anxious. She is aggressive.    MAU Course  Procedures None  MDM UA UPT CBC CMET [place]  Consulted with Dr. Marcelle Overlie. Pt demands a pelvic US to r/o ovarian cyst. Dr. Karilyn Cota for patient to have that done today.  Pt denies the need for pain medicine at this time.   Assessment and Plan   A: 1. Abdominal pain, right lower quadrant   2. Ovarian cyst, right    P: Discharge home Pt denies the need for pain medication RX for home Ok to take ibuprofen as needed, as directed on the bottle Call the office to schedule a follow up visit Return to  MAU as needed, if worsening symptoms  Iona Hansen Brodie Correll, NP 12/30/2012, 3:31 PM

## 2013-01-08 ENCOUNTER — Telehealth: Payer: Self-pay | Admitting: Emergency Medicine

## 2013-01-08 NOTE — Telephone Encounter (Signed)
I called and spoke with pt. She reports she saw her allergists at [facility] this afternoon. He gave her ZPAK and prednisone. She reports if she is not any better than she will call for appt. Nothing further needed

## 2013-01-08 NOTE — Telephone Encounter (Signed)
Called, spoke with pt - C/o prod cough with clear to yellow mucus, feels there is irritation in her upper bronchials, is weak, has wheezing, chest tightness/heaviness, and increased SOB mostly with activity x 1-2 days.  Unsure of fever.  Symptoms are worse qhs.  States last year this is how symptoms started and it went into pna.  Believes this is going to go "full blown into a resp infection" if she waits to get something done.  Currently taking sinulair, claritin, and flonase.  Also restarted ipratropium, albuterol, and budesonide nebs with this episode.  Nebs are giving some relief.  Is requesting OV, but we have no openings today or tomorrow.  Also, will need albuterol nebs refilled -- this isn't on her current med list.  RB, pls advise.  Thank you.  No Known Allergies - verified with pt  [facility] [address]

## 2013-01-08 NOTE — Telephone Encounter (Signed)
Agree she needs to be seen, even if leter in the week. In meantime call in prednisone > Take 40mg  daily for 3 days, then 30mg  daily for 3 days, then 20mg  daily for 3 days, then 10mg  daily for 3 days, then stop. Also call in doxycycline 100mg  bid x 7 days.

## 2013-01-17 ENCOUNTER — Encounter (INDEPENDENT_AMBULATORY_CARE_PROVIDER_SITE_OTHER): Payer: BC Managed Care – PPO | Admitting: General Surgery

## 2013-03-20 ENCOUNTER — Other Ambulatory Visit: Payer: Self-pay | Admitting: Gastroenterology

## 2013-03-21 IMAGING — CR DG CHEST 2V
2 series · 2 of 2 positions shown · non-contrast
Comparison: Chest x-ray of 02/29/2012

CLINICAL DATA: Right rib pain for several weeks, cough

CHEST - 2 VIEW

[view not recorded (1 of 2)]
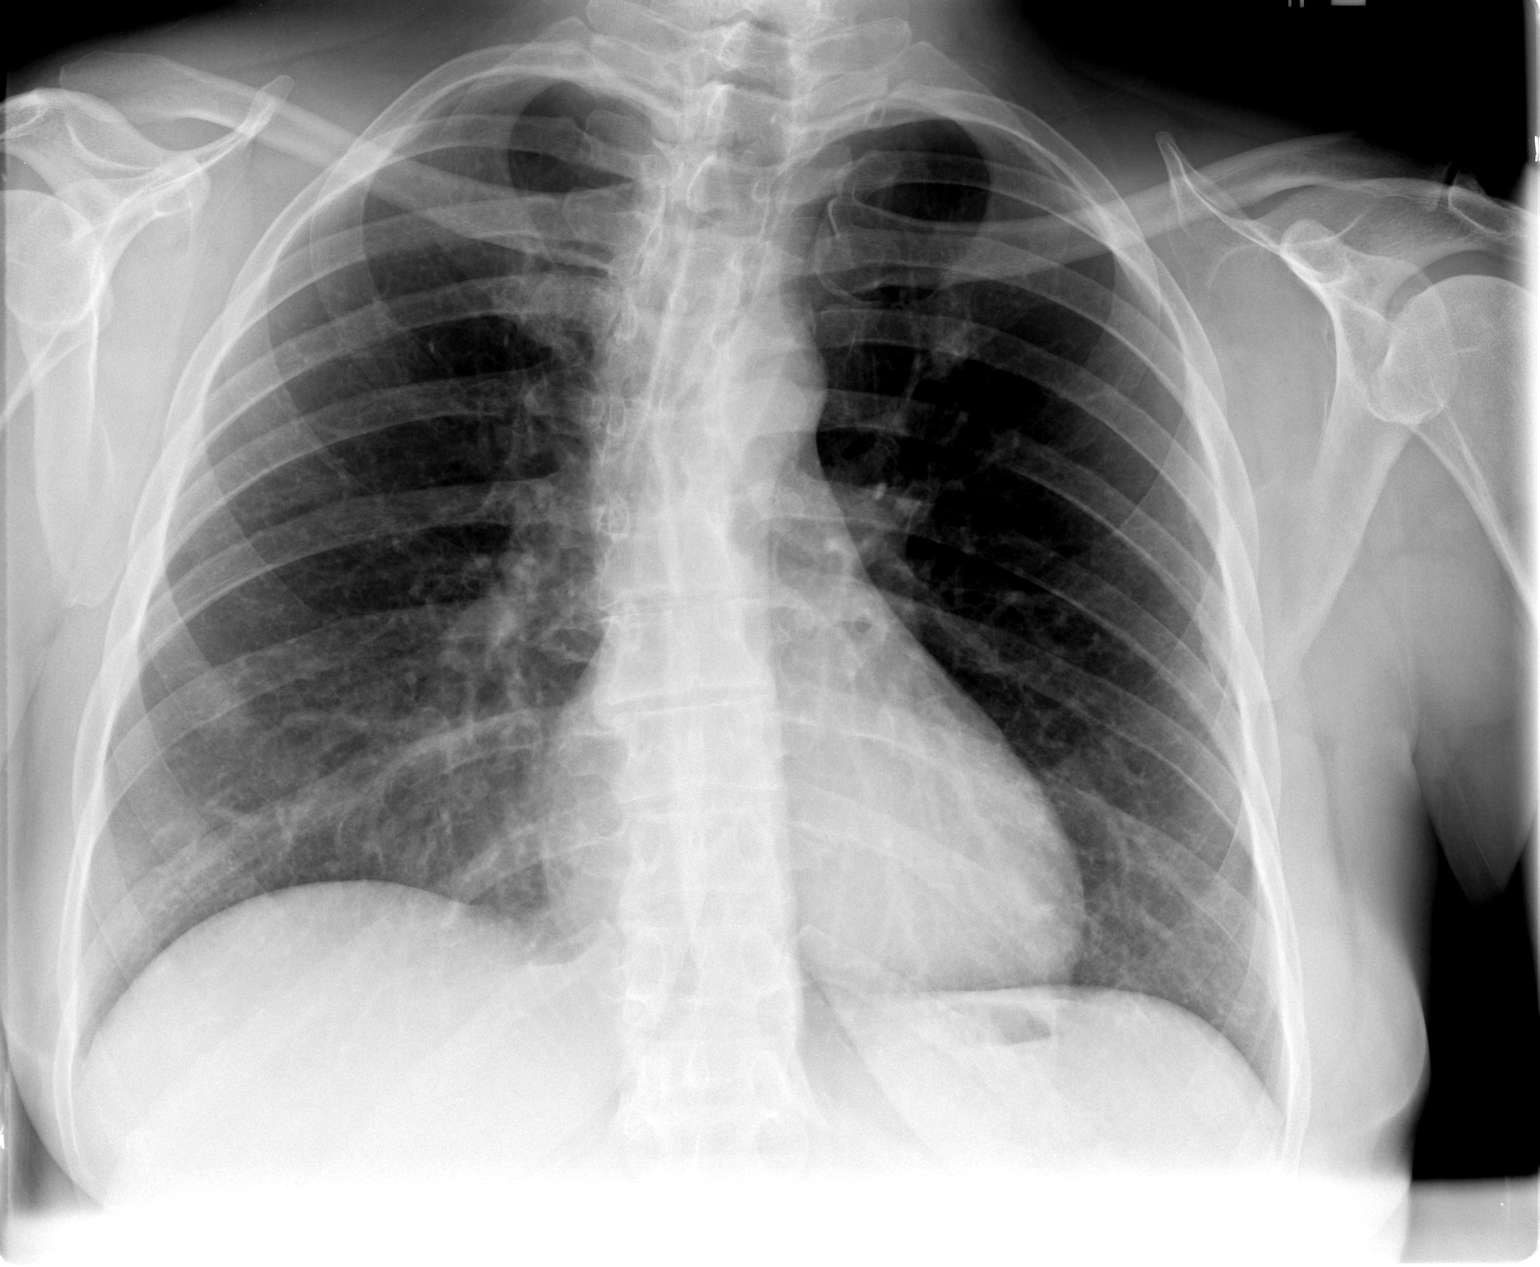

[view not recorded (2 of 2)]
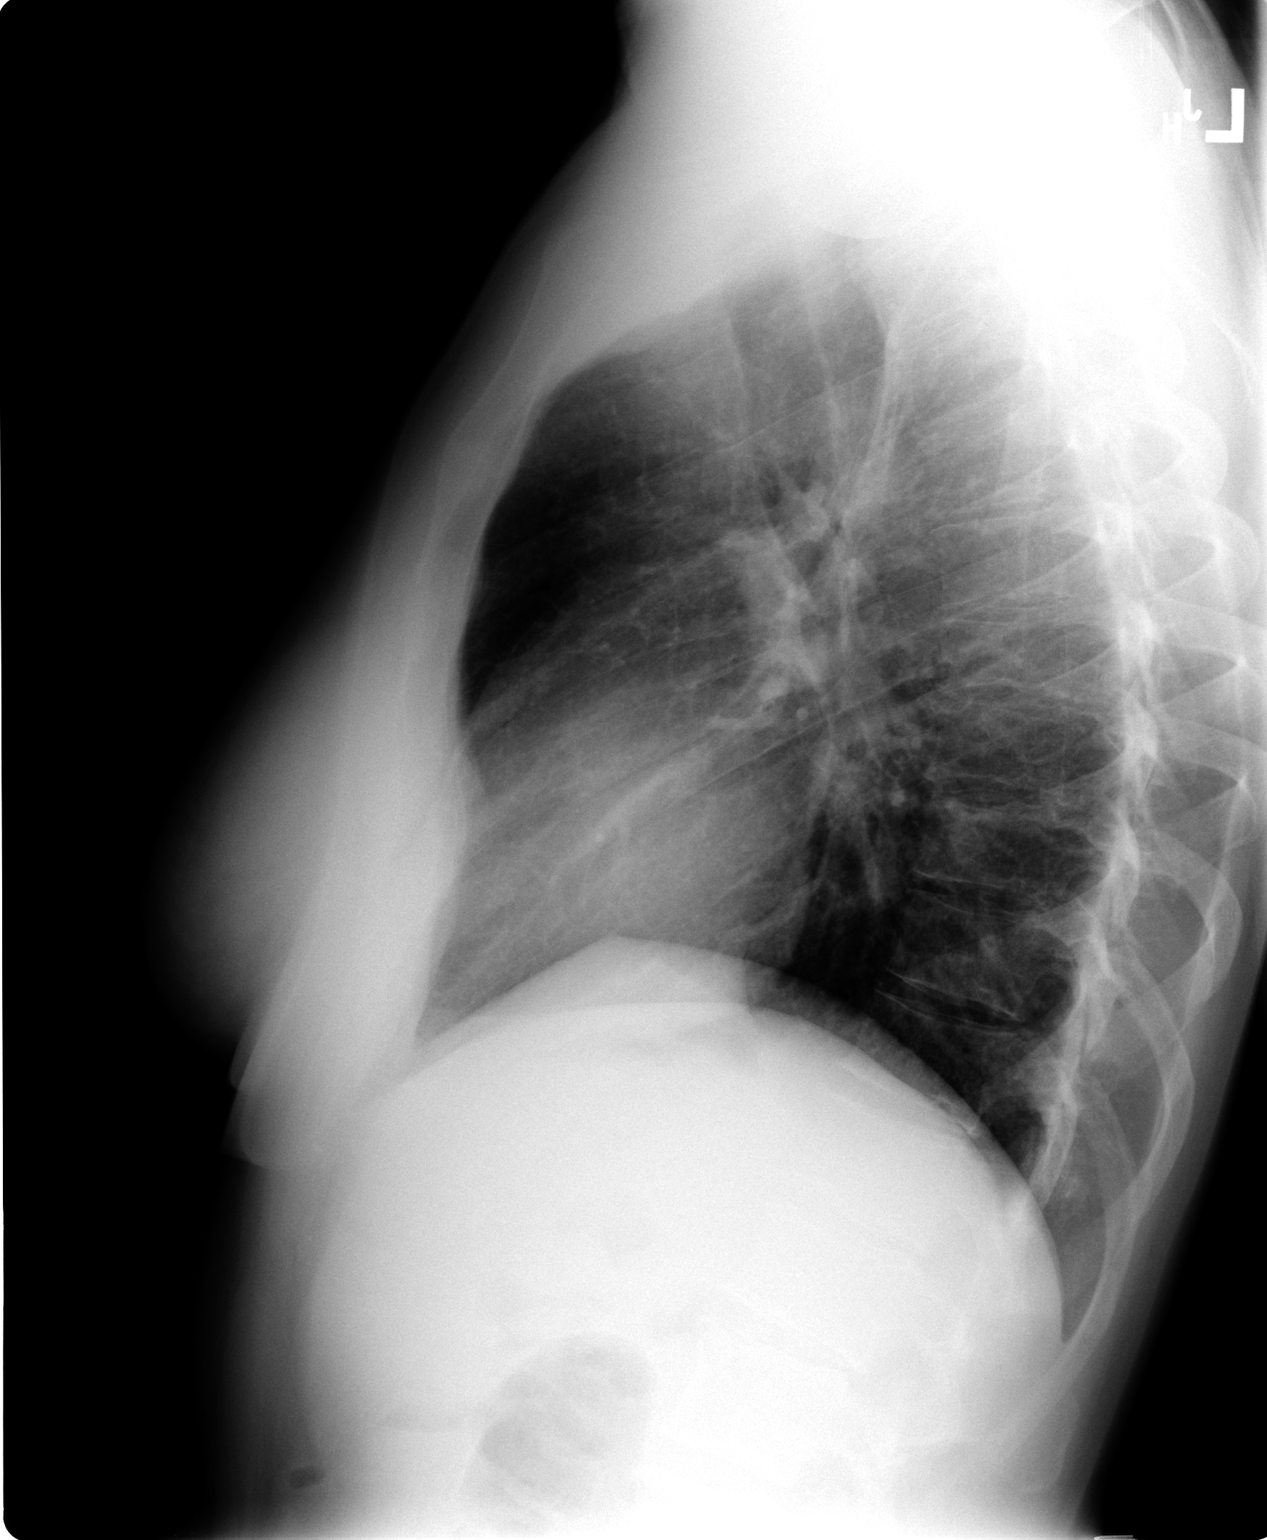

[2 of 2 positions shown; findings below may reference images not displayed]

FINDINGS: No active infiltrate or effusion is seen.  No
pneumothorax is noted.  Mediastinal contours appear normal.  Heart
size is stable.  No acute rib abnormality is seen.
IMPRESSION: No active lung disease.

## 2013-03-21 IMAGING — CR DG RIBS 2V*R*
3 series · 3 of 3 positions shown · non-contrast
Comparison: Chest x-ray dated 03/01/2012

CLINICAL DATA: Right rib pain.

RIGHT RIBS - 2 VIEW

[view not recorded (1 of 3)]
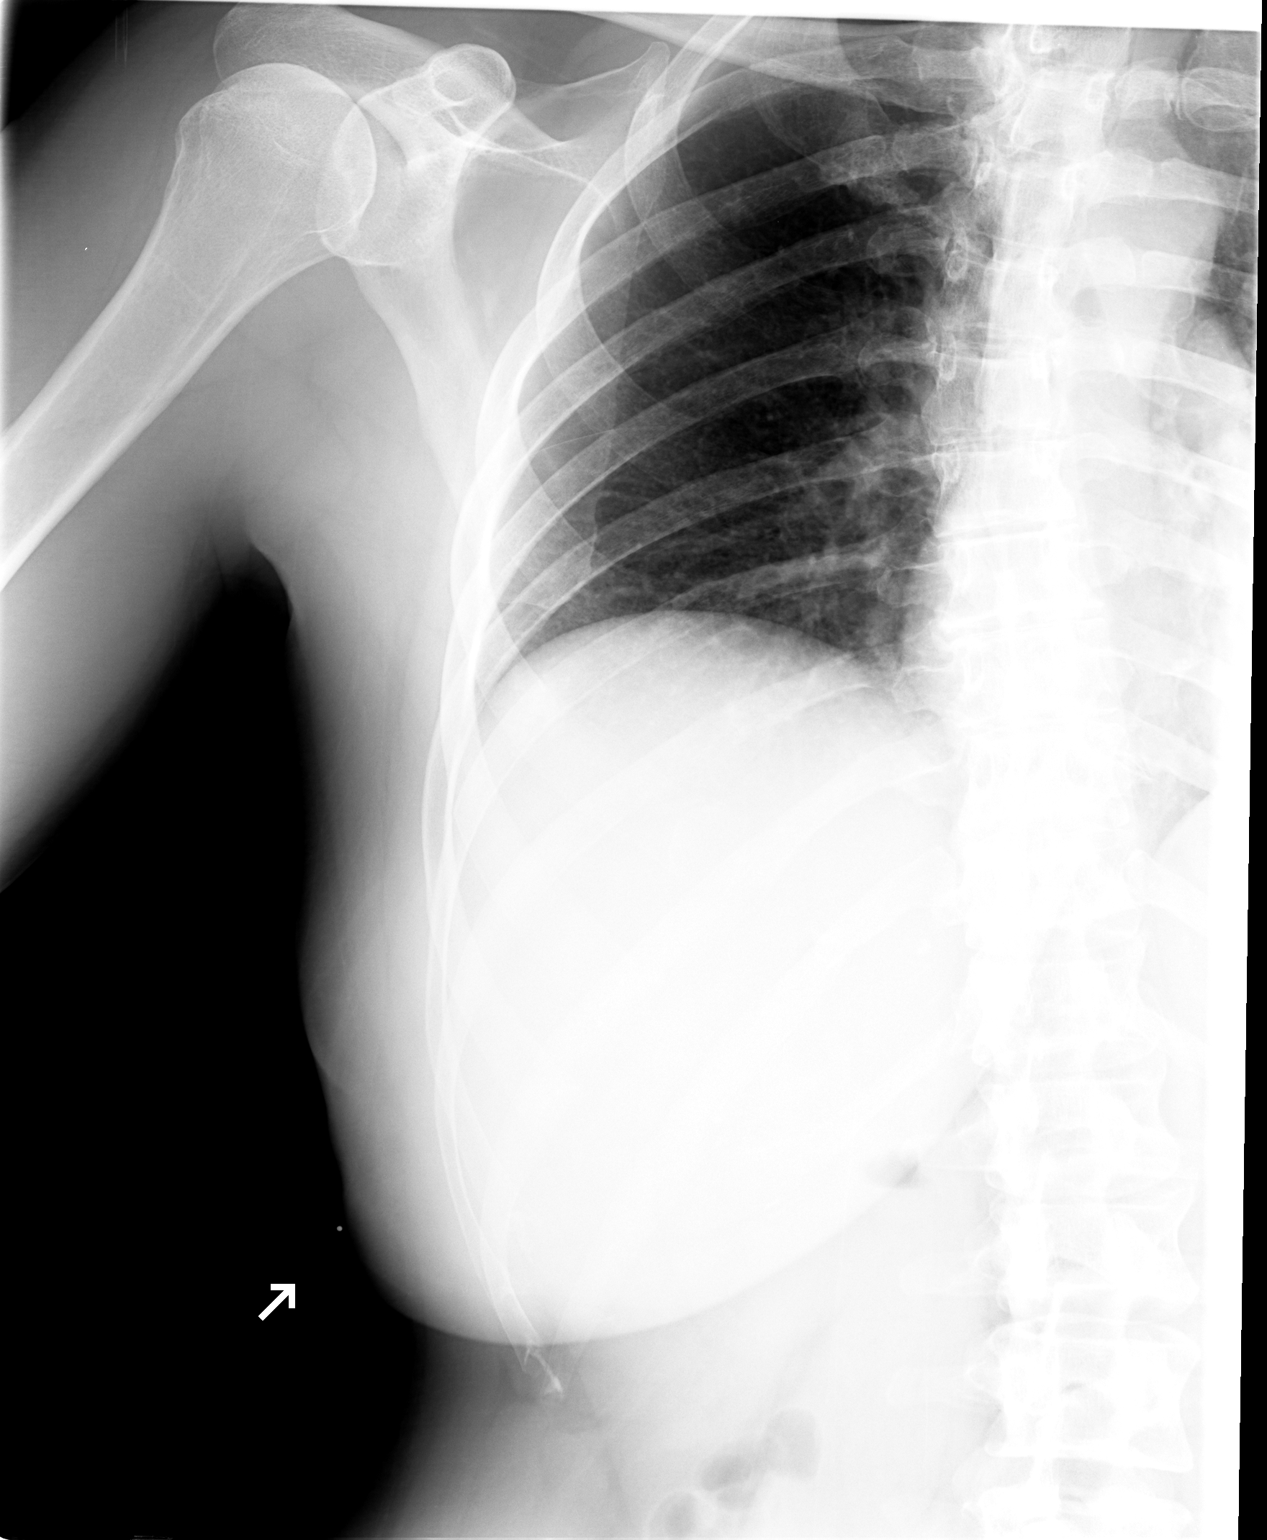

[view not recorded (2 of 3)]
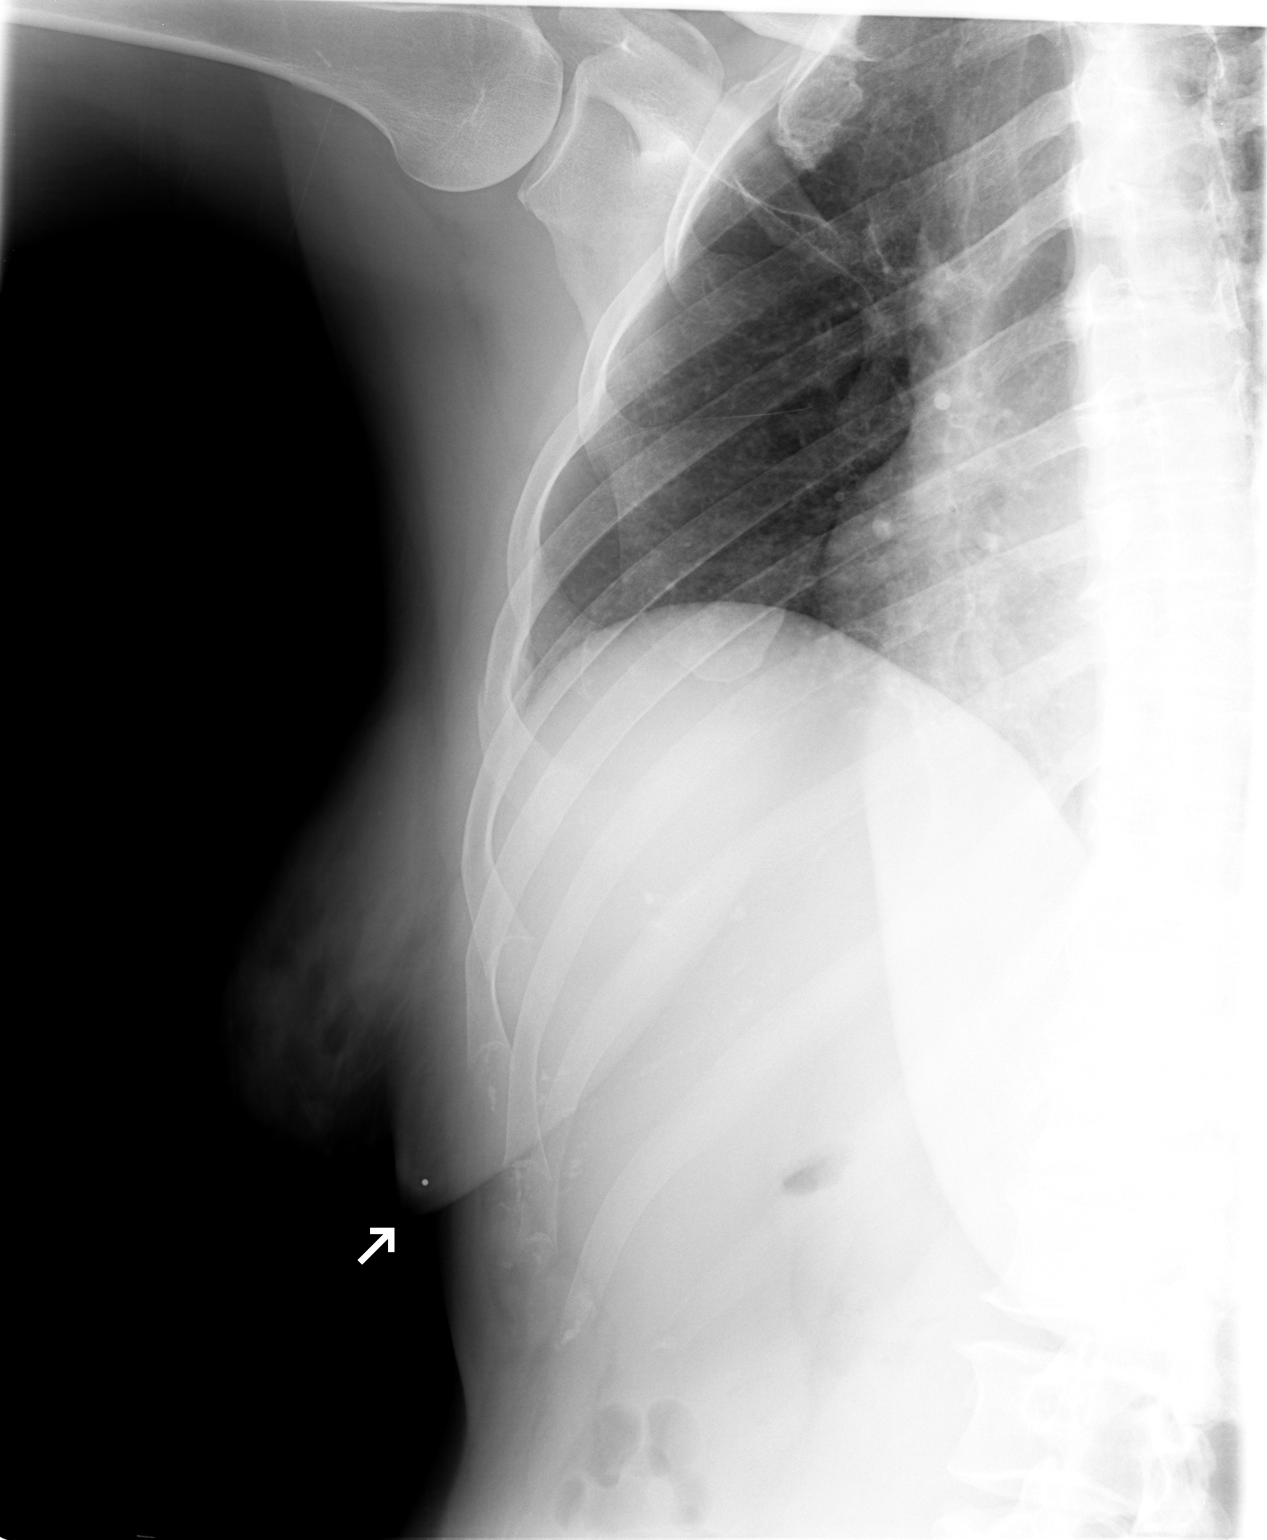

[view not recorded (3 of 3)]
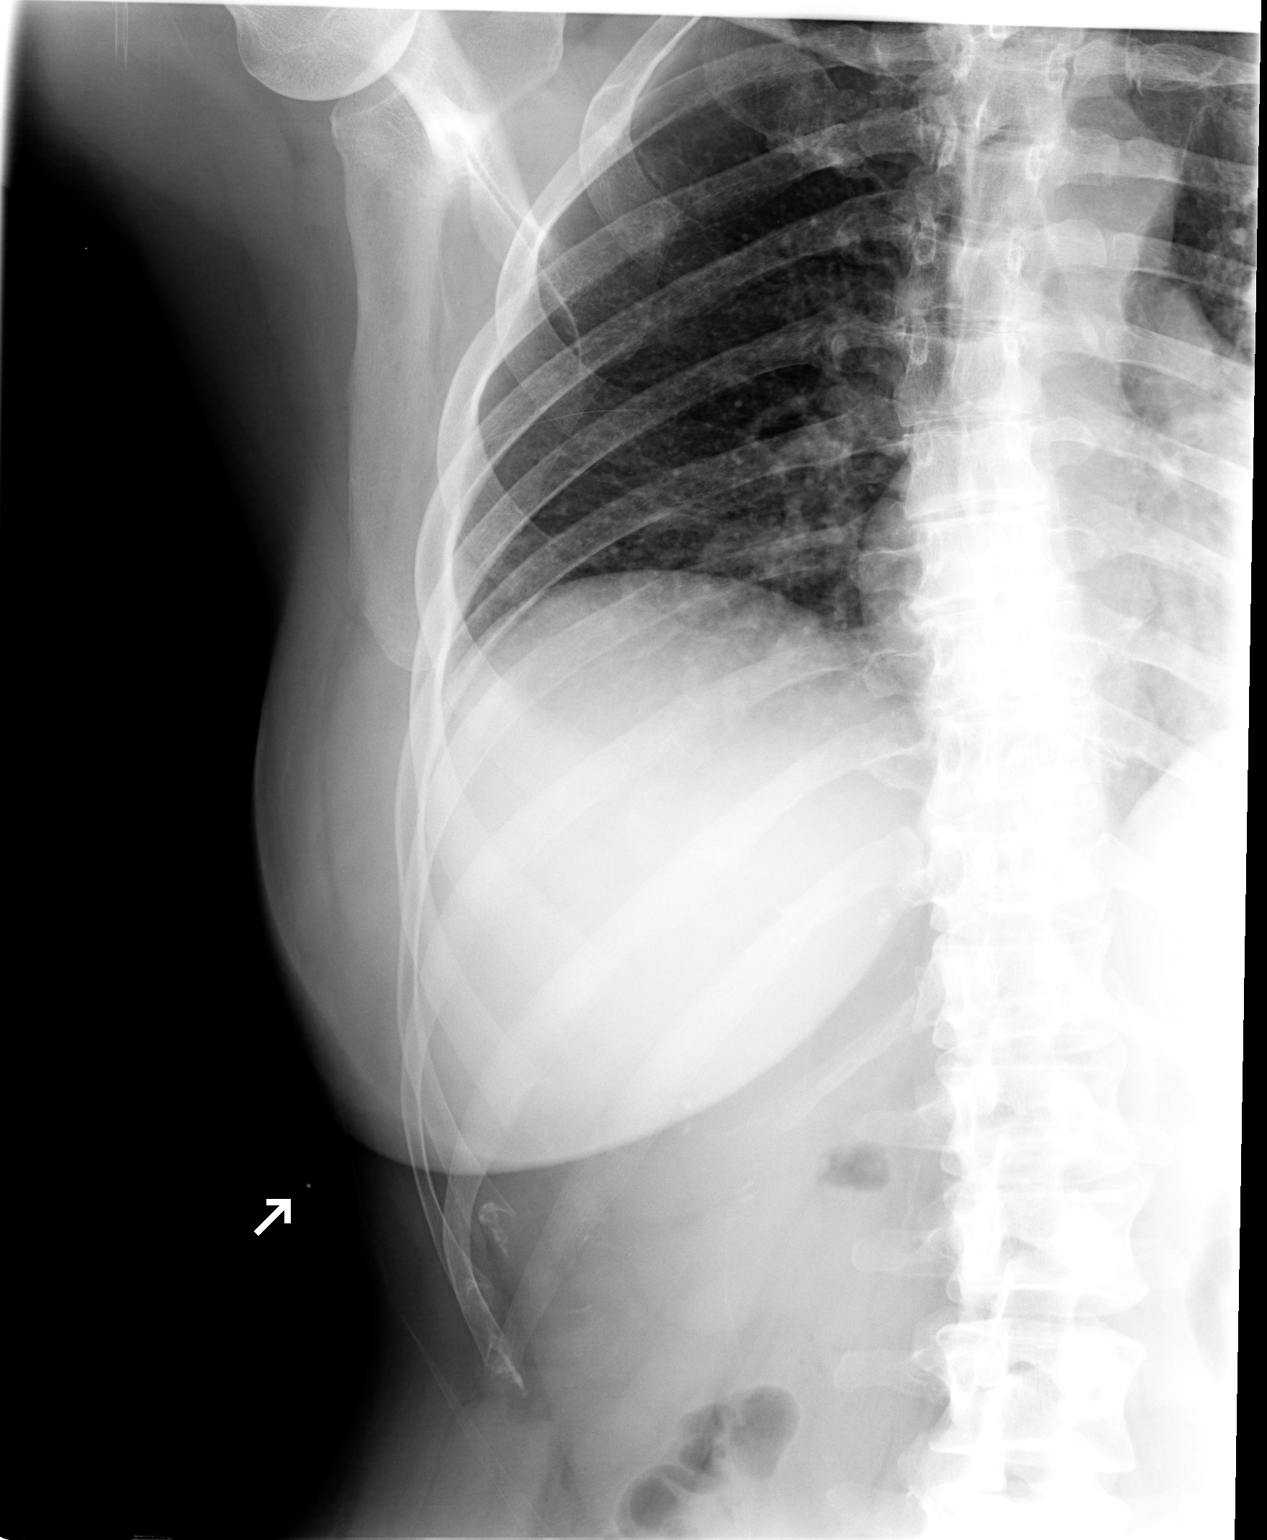

[3 of 3 positions shown; findings below may reference images not displayed]

FINDINGS: There is a subtle fracture of the anterior aspect of the
right fifth rib with some callus formation.  No other ribs appear
normal.  No effusion or atelectasis.
IMPRESSION: Healing fracture of the anterior aspect of the right fifth rib.

## 2013-04-26 ENCOUNTER — Other Ambulatory Visit: Payer: Self-pay | Admitting: Obstetrics and Gynecology

## 2013-10-30 ENCOUNTER — Other Ambulatory Visit: Payer: Self-pay | Admitting: Obstetrics and Gynecology

## 2013-11-01 LAB — CYTOLOGY - PAP

## 2013-11-12 ENCOUNTER — Encounter (HOSPITAL_COMMUNITY): Payer: Self-pay

## 2013-11-13 ENCOUNTER — Other Ambulatory Visit: Payer: Self-pay | Admitting: Obstetrics and Gynecology

## 2013-12-19 ENCOUNTER — Other Ambulatory Visit: Payer: Self-pay | Admitting: Obstetrics and Gynecology

## 2014-02-26 ENCOUNTER — Ambulatory Visit (INDEPENDENT_AMBULATORY_CARE_PROVIDER_SITE_OTHER): Payer: BLUE CROSS/BLUE SHIELD | Admitting: Podiatry

## 2014-02-26 ENCOUNTER — Encounter: Payer: Self-pay | Admitting: Podiatry

## 2014-02-26 VITALS — BP 108/65 | HR 70 | Resp 16 | Ht 69.0 in | Wt 170.0 lb

## 2014-02-26 DIAGNOSIS — L603 Nail dystrophy: Secondary | ICD-10-CM

## 2014-02-26 NOTE — Progress Notes (Signed)
She presents today with a concern regarding a cracked nail near the base of her hallux right. She denies trauma fever chills nausea vomiting. She states the toe really doesn't hurt. She states that she had the pain removed and the nail was split and she had them trimmed the nail down. She is concerned whether or not she should have the nail pain and again until this grows out.  Objective: Vital signs are stable she is alert and oriented 3. Pulses are palpable. The nail plate does have a small split just past the lunula along the lateral border. I see no signs of infection no purulence or malodor. There is no pain on palpation.  Assessment: Split nail along the fibular border transversely. I see no signs of onychomycosis.  Plan: I encouraged her to simply pink the nail or fill the void with nail filler. We'll follow up with her as needed.

## 2014-02-27 ENCOUNTER — Ambulatory Visit: Payer: Self-pay | Admitting: Podiatry

## 2014-04-18 ENCOUNTER — Other Ambulatory Visit: Payer: Self-pay | Admitting: Obstetrics and Gynecology

## 2014-04-22 LAB — CYTOLOGY - PAP

## 2014-06-10 ENCOUNTER — Inpatient Hospital Stay (HOSPITAL_COMMUNITY)
Admission: AD | Admit: 2014-06-10 | Discharge: 2014-06-10 | Disposition: A | Discharge status: Home / Self Care | Payer: BLUE CROSS/BLUE SHIELD | Source: Ambulatory Visit | Attending: Obstetrics and Gynecology | Admitting: Obstetrics and Gynecology

## 2014-06-10 ENCOUNTER — Encounter (HOSPITAL_COMMUNITY): Payer: Self-pay | Admitting: *Deleted

## 2014-06-10 DIAGNOSIS — K589 Irritable bowel syndrome without diarrhea: Secondary | ICD-10-CM | POA: Insufficient documentation

## 2014-06-10 DIAGNOSIS — K644 Residual hemorrhoidal skin tags: Secondary | ICD-10-CM | POA: Insufficient documentation

## 2014-06-10 DIAGNOSIS — N760 Acute vaginitis: Secondary | ICD-10-CM | POA: Diagnosis not present

## 2014-06-10 DIAGNOSIS — J45909 Unspecified asthma, uncomplicated: Secondary | ICD-10-CM | POA: Diagnosis not present

## 2014-06-10 DIAGNOSIS — Z79899 Other long term (current) drug therapy: Secondary | ICD-10-CM | POA: Insufficient documentation

## 2014-06-10 DIAGNOSIS — R102 Pelvic and perineal pain: Secondary | ICD-10-CM | POA: Diagnosis present

## 2014-06-10 HISTORY — DX: Reserved for concepts with insufficient information to code with codable children: IMO0002

## 2014-06-10 LAB — POCT PREGNANCY, URINE: PREG TEST UR: NEGATIVE

## 2014-06-10 LAB — URINALYSIS, ROUTINE W REFLEX MICROSCOPIC
Bilirubin Urine: NEGATIVE
GLUCOSE, UA: NEGATIVE mg/dL
Hgb urine dipstick: NEGATIVE
KETONES UR: NEGATIVE mg/dL
Nitrite: NEGATIVE
Protein, ur: NEGATIVE mg/dL
Specific Gravity, Urine: <1.005 — ABNORMAL LOW (ref 1.005–1.030)
Urobilinogen, UA: 0.2 mg/dL (ref 0.0–1.0)
pH: 7 (ref 5.0–8.0)

## 2014-06-10 LAB — URINE MICROSCOPIC-ADD ON

## 2014-06-10 MED ORDER — HYDROCORTISONE ACETATE 25 MG RE SUPP: 25.0000 mg | Freq: Two times a day (BID) | RECTAL | Status: DC

## 2014-06-10 MED ORDER — FLUCONAZOLE 150 MG PO TABS: 150.0000 mg | ORAL_TABLET | Freq: Every day | ORAL | Status: DC

## 2014-06-10 NOTE — MAU Provider Note (Signed)
History     CSN: [id number]  Arrival date and time: 06/10/14 1011   None     Chief Complaint  Patient presents with  . Vaginal Pain  . Pelvic Pain   HPI   [name] is a 49 y.o. female who presents with vaginitis and pelvic pain. The pelvic pain started yesterday along with vaginitis; the symptom started at the same time.  She treated herself with monistat last night due to vaginitis. She does not feel this has helped. Her vagina is swollen and read.   She is also concerned about a hemorrhoid that she feels is thrombosed. She tried over the counter medication for this and called the general surgeon and she was told to call the office tomorrow. She plans to call tomorrow. Since using the over the counter treatment she feels the symptoms have improved.   OB History    Gravida Para Term Preterm AB TAB SAB Ectopic Multiple Living   1 1 1       1       Past Medical History  Diagnosis Date  . Irritable bowel syndrome (IBS)   . Asthma 02/19/2012  . Abnormal Pap smear   . Fibroid   . HPV test positive     Past Surgical History  Procedure Laterality Date  . Turbinate reconstruction    . Right shoulder decompression    . Cesarean section    . Cervical cone biopsy    . Abdominoplasty    . Laparoscopic appendectomy N/A 11/23/2012    Procedure: APPENDECTOMY LAPAROSCOPIC;  Surgeon: Odis Hollingshead, MD;  Location: [facility];  Service: General;  Laterality: N/A;    Family History  Problem Relation Age of Onset  . Heart disease Mother   . Heart disease Father   . Heart disease Sister   . Heart disease Brother   . Emphysema Father   . Emphysema Mother   . Asthma Mother   . Cancer Maternal Grandfather     leukemia  . Cancer Mother     squamous cell skin cancer    History  Substance Use Topics  . Smoking status: Never Smoker   . Smokeless tobacco: Never Used  . Alcohol Use: 0.0 oz/week    0 Standard drinks or equivalent per week     Comment: social     Allergies: No Known Allergies  Prescriptions prior to admission  Medication Sig Dispense Refill Last Dose  . albuterol (PROVENTIL HFA;VENTOLIN HFA) 108 (90 BASE) MCG/ACT inhaler Inhale 2 puffs into the lungs every 6 (six) hours as needed for wheezing. Use with spacer 1 Inhaler 0 Taking  . ALPRAZolam (XANAX) 0.5 MG tablet Take 0.5 mg by mouth as needed for anxiety or sleep.   Taking  . Cholecalciferol (VITAMIN D3 MAXIMUM STRENGTH PO) Take 7,000 Units by mouth daily.   Taking  . conjugated estrogens (PREMARIN) vaginal cream Place 0.5 Applicatorfuls vaginally See admin instructions. 3 times a week   Not Taking  . Cyanocobalamin 5000 MCG SUBL Place 5,000 mcg under the tongue daily.   Not Taking  . docusate sodium 100 MG CAPS Take 100 mg by mouth 2 (two) times daily as needed for mild constipation.  0 Past Month at Unknown time  . fluticasone (FLONASE) 50 MCG/ACT nasal spray Place 1 spray into the nose daily.    Taking  . fluticasone (FLOVENT HFA) 110 MCG/ACT inhaler Inhale 2 puffs into the lungs daily as needed (asthma).    Not Taking  .  FOLIC ACID PO Place 1 tablet under the tongue daily.   Not Taking  . Linaclotide (LINZESS PO) Take 1 tablet by mouth as needed (constipation).   Not Taking  . magnesium aspartate (MAGINEX) 615 MG tablet Take 615 mg by mouth daily.    Taking  . montelukast (SINGULAIR) 10 MG tablet Take 10 mg by mouth at bedtime.   Taking  . Probiotic Product (ALIGN PO) Take 1 tablet by mouth daily.   Taking  . UNABLE TO FIND Estrogen testosteron .5/.75 three times a week Progesterone cream 2% - topically on day 7-28 of cycle   Taking   Results for orders placed or performed during the hospital encounter of 06/10/14 (from the past 48 hour(s))  Urinalysis, Routine w reflex microscopic     Status: Abnormal   Collection Time: 06/10/14 10:20 AM  Result Value Ref Range   Color, Urine YELLOW YELLOW   APPearance CLEAR CLEAR   Specific Gravity, Urine <1.005 (L) 1.005 - 1.030   pH  7.0 5.0 - 8.0   Glucose, UA NEGATIVE NEGATIVE mg/dL   Hgb urine dipstick NEGATIVE NEGATIVE   Bilirubin Urine NEGATIVE NEGATIVE   Ketones, ur NEGATIVE NEGATIVE mg/dL   Protein, ur NEGATIVE NEGATIVE mg/dL   Urobilinogen, UA 0.2 0.0 - 1.0 mg/dL   Nitrite NEGATIVE NEGATIVE   Leukocytes, UA SMALL (A) NEGATIVE  Urine microscopic-add on     Status: None   Collection Time: 06/10/14 10:20 AM  Result Value Ref Range   Squamous Epithelial / LPF RARE RARE   WBC, UA 0-2 <3 WBC/hpf  Pregnancy, urine POC     Status: None   Collection Time: 06/10/14 10:28 AM  Result Value Ref Range   Preg Test, Ur NEGATIVE NEGATIVE    Comment:        THE SENSITIVITY OF THIS METHODOLOGY IS >24 mIU/mL     Review of Systems  Genitourinary: Negative for dysuria, urgency and frequency.       + vaginal discharge; thick, white    Physical Exam   Blood pressure 125/89, pulse 67, temperature 97.8 F (36.6 C), temperature source Oral, resp. rate 16.  Physical Exam  Constitutional: She is oriented to person, place, and time. She appears well-developed and well-nourished. No distress.  HENT:  Head: Normocephalic.  Eyes: Pupils are equal, round, and reactive to light.  Neck: Neck supple.  GI: Soft. She exhibits no distension. There is no tenderness. There is no rebound.  Genitourinary: Rectal exam shows external hemorrhoid (non thrombosed 1 cm external hemorrhoid. Tender ).    There is tenderness on the right labia. There is tenderness on the left labia. There is erythema and tenderness in the vagina.  Speculum exam: Vagina - Small amount of white, thick discharge. No odor.  Cervix - No contact bleeding Bimanual exam: Cervix closed, no CMT  Uterus non tender, normal size Adnexa non tender, no masses bilaterally Chaperone present for exam.  Neurological: She is alert and oriented to person, place, and time.  Skin: Skin is warm. She is not diaphoretic.  Psychiatric: Her behavior is normal.    MAU Course   Procedures  None  MDM Discussed patient with Dr. Corinna Capra who agrees with plan of care.  Unable to collect wet prep due to monistat use.   Assessment and Plan   A:  1. Vaginitis and vulvovaginitis   2. External hemorrhoid     P:  Discharge home in stable condition.  RX: anusol  Diflucan  Follow up with Dr. Helane Rima; call to schedule Follow up with general surgery as needed for hemorrhoid   Lezlie Lye, NP 06/10/2014 3:08 PM

## 2014-06-10 NOTE — MAU Note (Addendum)
C/o burning vaginal pain with pressure; c/o rectal pain form hemorhoid; hx of abnormal pap smears and pt is concerned; HPV positive; c/o of constipation for 3 days;

## 2014-07-08 ENCOUNTER — Other Ambulatory Visit: Payer: Self-pay | Admitting: Obstetrics and Gynecology

## 2014-07-09 LAB — CYTOLOGY - PAP

## 2015-03-11 ENCOUNTER — Encounter: Payer: Self-pay | Admitting: Podiatry

## 2015-03-11 ENCOUNTER — Ambulatory Visit (INDEPENDENT_AMBULATORY_CARE_PROVIDER_SITE_OTHER): Payer: BLUE CROSS/BLUE SHIELD

## 2015-03-11 ENCOUNTER — Ambulatory Visit (INDEPENDENT_AMBULATORY_CARE_PROVIDER_SITE_OTHER): Payer: BLUE CROSS/BLUE SHIELD | Admitting: Podiatry

## 2015-03-11 VITALS — BP 125/74 | HR 73 | Resp 16

## 2015-03-11 DIAGNOSIS — M2041 Other hammer toe(s) (acquired), right foot: Secondary | ICD-10-CM | POA: Diagnosis not present

## 2015-03-11 DIAGNOSIS — M79674 Pain in right toe(s): Secondary | ICD-10-CM

## 2015-03-11 DIAGNOSIS — M205X1 Other deformities of toe(s) (acquired), right foot: Secondary | ICD-10-CM

## 2015-03-11 DIAGNOSIS — M25371 Other instability, right ankle: Secondary | ICD-10-CM

## 2015-03-11 NOTE — Progress Notes (Signed)
She presents today with a chief complaint of a painful first metatarsophalangeal joint of the right foot fourth toe of the right foot fifth toe of the right foot and severe lateral ankle instability. She comes to [place] for second opinion regarding all of this. She has previously been seen by Dr. Noemi Chapel of [facility]. He has provided her with injections to the right foot around the first metatarsophalangeal joint the latest being in December 2016. She would like to consider surgical intervention at this point. She states that this is starting to affect her ability to perform her daily activities with his work or exercise. 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 She states that she is concerned about possible immune issues such as autoimmune disease. She is being evaluated in the near future by a nephrologist because of an increased GFR.  Objective: I reviewed her past medical history medications allergy surgery social history and review of systems. Pulses are strongly palpable neurologic sensorium is intact. Deep tendon reflexes are intact bilateral. Muscle strength is normal. Orthopedic evaluation demonstrates severe laxity to the lateral ankle of the right foot. He does demonstrate a palpable tailor tilt and anterior drawer. The anterior drawer is minimal but painful. She has painful range of motion of the first metatarsophalangeal joint. It is obviously enlarged compared to her contralateral foot and is much tighter on range of motion than the contralateral foot. She has a slight adductovarus rotation and her fourth digit right foot which irritates her while walking as it is pushed beneath third toe. 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 Radiographs taken today 3 views in the office demonstrate moderate to severe osteoarthritis of the first  metatarsophalangeal joint. The fourth and fifth digits are simply adductovarus rotated hammertoes. Ankle joint appears to be relatively normal however I'm concerned about ligamentous laxity or rupture from multiple injuries.  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  Plan: Conservative therapies including brace and evaluation and treatment from other doctors have failed which will necessitate an MRI of the right foot. We also discussed the need for a Keller arthroplasty with a single silicone implant but this also depend on our autoimmune status. I will follow up with her once her MRI is complete.

## 2015-03-12 ENCOUNTER — Telehealth: Payer: Self-pay | Admitting: *Deleted

## 2015-03-12 NOTE — Telephone Encounter (Signed)
[employer] PRIOR AUTHORIZATION ORDER# [id number], VALID 03/12/2015 TO 04/10/2015, FOR MRI 09811 RIGHT ANKLE WITHOUT CONTRAST FOR ANKLE INSTABILITY M25.371. FAXED TO [facility].

## 2015-03-20 ENCOUNTER — Inpatient Hospital Stay: Admission: RE | Admit: 2015-03-20 | Payer: BLUE CROSS/BLUE SHIELD | Source: Ambulatory Visit

## 2015-04-08 ENCOUNTER — Other Ambulatory Visit: Payer: Self-pay | Admitting: Nephrology

## 2015-04-08 DIAGNOSIS — N182 Chronic kidney disease, stage 2 (mild): Secondary | ICD-10-CM

## 2015-04-10 ENCOUNTER — Other Ambulatory Visit: Payer: BLUE CROSS/BLUE SHIELD

## 2015-04-11 ENCOUNTER — Ambulatory Visit
Admission: RE | Admit: 2015-04-11 | Discharge: 2015-04-11 | Disposition: A | Discharge status: Home / Self Care | Payer: BLUE CROSS/BLUE SHIELD | Source: Ambulatory Visit | Attending: Nephrology | Admitting: Nephrology

## 2015-04-11 DIAGNOSIS — N182 Chronic kidney disease, stage 2 (mild): Secondary | ICD-10-CM

## 2015-04-15 DIAGNOSIS — N182 Chronic kidney disease, stage 2 (mild): Secondary | ICD-10-CM | POA: Diagnosis not present

## 2015-04-22 DIAGNOSIS — Z1231 Encounter for screening mammogram for malignant neoplasm of breast: Secondary | ICD-10-CM | POA: Diagnosis not present

## 2015-04-28 DIAGNOSIS — S139XXA Sprain of joints and ligaments of unspecified parts of neck, initial encounter: Secondary | ICD-10-CM | POA: Diagnosis not present

## 2015-04-28 DIAGNOSIS — M47812 Spondylosis without myelopathy or radiculopathy, cervical region: Secondary | ICD-10-CM | POA: Diagnosis not present

## 2015-04-29 ENCOUNTER — Ambulatory Visit: Payer: BLUE CROSS/BLUE SHIELD | Admitting: Podiatry

## 2015-06-10 DIAGNOSIS — F4325 Adjustment disorder with mixed disturbance of emotions and conduct: Secondary | ICD-10-CM | POA: Diagnosis not present

## 2015-06-27 DIAGNOSIS — E039 Hypothyroidism, unspecified: Secondary | ICD-10-CM | POA: Diagnosis not present

## 2015-06-27 DIAGNOSIS — R5383 Other fatigue: Secondary | ICD-10-CM | POA: Diagnosis not present

## 2015-06-27 DIAGNOSIS — N951 Menopausal and female climacteric states: Secondary | ICD-10-CM | POA: Diagnosis not present

## 2015-06-27 DIAGNOSIS — B360 Pityriasis versicolor: Secondary | ICD-10-CM | POA: Diagnosis not present

## 2015-06-30 DIAGNOSIS — F4325 Adjustment disorder with mixed disturbance of emotions and conduct: Secondary | ICD-10-CM | POA: Diagnosis not present

## 2015-07-16 DIAGNOSIS — F4325 Adjustment disorder with mixed disturbance of emotions and conduct: Secondary | ICD-10-CM | POA: Diagnosis not present

## 2015-08-07 DIAGNOSIS — F4325 Adjustment disorder with mixed disturbance of emotions and conduct: Secondary | ICD-10-CM | POA: Diagnosis not present

## 2015-08-08 DIAGNOSIS — N182 Chronic kidney disease, stage 2 (mild): Secondary | ICD-10-CM | POA: Diagnosis not present

## 2015-08-08 DIAGNOSIS — E559 Vitamin D deficiency, unspecified: Secondary | ICD-10-CM | POA: Diagnosis not present

## 2015-08-08 DIAGNOSIS — D649 Anemia, unspecified: Secondary | ICD-10-CM | POA: Diagnosis not present

## 2015-08-13 DIAGNOSIS — Z6823 Body mass index (BMI) 23.0-23.9, adult: Secondary | ICD-10-CM | POA: Diagnosis not present

## 2015-08-13 DIAGNOSIS — Z01419 Encounter for gynecological examination (general) (routine) without abnormal findings: Secondary | ICD-10-CM | POA: Diagnosis not present

## 2015-08-20 DIAGNOSIS — E559 Vitamin D deficiency, unspecified: Secondary | ICD-10-CM | POA: Diagnosis not present

## 2015-08-20 DIAGNOSIS — N182 Chronic kidney disease, stage 2 (mild): Secondary | ICD-10-CM | POA: Diagnosis not present

## 2015-08-21 DIAGNOSIS — F4325 Adjustment disorder with mixed disturbance of emotions and conduct: Secondary | ICD-10-CM | POA: Diagnosis not present

## 2015-08-25 DIAGNOSIS — F4325 Adjustment disorder with mixed disturbance of emotions and conduct: Secondary | ICD-10-CM | POA: Diagnosis not present

## 2015-09-01 ENCOUNTER — Encounter: Payer: Self-pay | Admitting: Emergency Medicine

## 2015-09-01 ENCOUNTER — Ambulatory Visit (INDEPENDENT_AMBULATORY_CARE_PROVIDER_SITE_OTHER): Payer: BLUE CROSS/BLUE SHIELD | Admitting: Emergency Medicine

## 2015-09-01 ENCOUNTER — Encounter (INDEPENDENT_AMBULATORY_CARE_PROVIDER_SITE_OTHER): Payer: Self-pay

## 2015-09-01 ENCOUNTER — Ambulatory Visit (INDEPENDENT_AMBULATORY_CARE_PROVIDER_SITE_OTHER)
Admission: RE | Admit: 2015-09-01 | Discharge: 2015-09-01 | Disposition: A | Discharge status: Home / Self Care | Payer: BLUE CROSS/BLUE SHIELD | Source: Ambulatory Visit | Attending: Emergency Medicine | Admitting: Emergency Medicine

## 2015-09-01 VITALS — BP 134/90 | HR 70 | Ht 69.0 in | Wt 174.0 lb

## 2015-09-01 DIAGNOSIS — J4540 Moderate persistent asthma, uncomplicated: Secondary | ICD-10-CM

## 2015-09-01 DIAGNOSIS — J4520 Mild intermittent asthma, uncomplicated: Secondary | ICD-10-CM | POA: Diagnosis not present

## 2015-09-01 DIAGNOSIS — J45909 Unspecified asthma, uncomplicated: Secondary | ICD-10-CM | POA: Diagnosis not present

## 2015-09-01 NOTE — Assessment & Plan Note (Signed)
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 She has been more symptomatic lately and poor sensitive to workouts, cold air. She has been on a decreased dose of Flovent, and most recently has been only rarely using it. I think at this point I would like to recheck pulmonary function with a methacholine challenge to compare her spirometry was last time and also to establish true hyperresponsiveness. Given her increased dyspnea we will also check a chest x-ray. We will stop the Flovent until these studies have been done. She'll continue to have albuterol to use as needed. Continue Singulair and loratadine

## 2015-09-01 NOTE — Patient Instructions (Addendum)
We will perform a methacholine change pulmonary function test We will perform a CXR  We will STOP Flovent for now Take albuterol 2 puffs up to every 4 hours if needed for shortness of breath.  Follow with Dr Lamonte Sakai next available after the PFT to review the results.

## 2015-09-01 NOTE — Progress Notes (Signed)
Subjective:    Patient ID: Caitlyn Pruitt, female    DOB: December 07, 1965, 50 y.o.   MRN: [id number]  Asthma  She complains of cough and shortness of breath. There is no wheezing. Pertinent negatives include no ear pain, fever, headaches, postnasal drip, rhinorrhea, sneezing, sore throat or trouble swallowing. Her past medical history is significant for asthma.   50 yo never smoker, hx of IBS, allergic rhinitis, asthma dx by Dr Harold Hedge in march '14. She has family hx of COPD and asthma.  She reports URI sx last Fall, has never really felt well since. Has experienced wheeze, cough. Has identified triggers of cold air, some fumes, exercise, possibly GERD. Treated with pred + abx. Was evaluated by Dr Sharlett Iles, underwent spirometry. Has been treated with atrovent/pulmocort nebs not currently, flovent since March. She has noted a clinical response to albuterol.  She is on fluticasone, singulair.   ROV 08/29/12 -- follows up for dyspnea, allergies, suspected asthma. Suspected contributions from allergies and GERD (not currently treated). She is off singulair, flovent. She has been on fluticasone. 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 She continues to cough. Uses and benefits from albuterol before exercise.   ROV 09/01/15 -- Patient is here to reestablish care. She's not been seen in 3 years. She has a history of allergies and possible asthma. Her pulmonary function testing from August 2014 show Some possible modest reduction based on the curve of her flow volume loop.. No bronchodilator response. She reports that last August she had some weakness and dysphasia, was found to have candida. She was on higher dose flovent at the time, currently on 58mcg 2 puffs qd. Also on singulair. She uses albuterol more lately, but acknowledges that she is not reliable with her Flovent. She had some GI sx - nausea, diarrhea last week. Was started back empirically on nystatin. Still feels some airway irritation, mucous  production.    Review of Systems  Constitutional: Negative for fever and unexpected weight change.  HENT: Negative for congestion, dental problem, ear pain, nosebleeds, postnasal drip, rhinorrhea, sinus pressure, sneezing, sore throat and trouble swallowing.   Eyes: Negative for redness and itching.  Respiratory: Positive for cough and shortness of breath. Negative for chest tightness and wheezing.   Cardiovascular: Negative for palpitations and leg swelling.  Gastrointestinal: Negative for nausea and vomiting.  Genitourinary: Negative for dysuria.  Musculoskeletal: Negative for joint swelling.  Skin: Negative for rash.  Neurological: Negative for headaches.  Hematological: Does not bruise/bleed easily.  Psychiatric/Behavioral: Negative for dysphoric mood. The patient is not nervous/anxious.    Past Medical History:  Diagnosis Date  . Abnormal Pap smear   . Asthma 02/19/2012  . Fibroid   . HPV test positive   . Irritable bowel syndrome (IBS)      Family History  Problem Relation Age of Onset  . Heart disease Mother   . Heart disease Father   . Heart disease Sister   . Heart disease Brother   . Emphysema Father   . Emphysema Mother   . Asthma Mother   . Cancer Maternal Grandfather     leukemia  . Cancer Mother     squamous cell skin cancer     Social History   Social History  . Marital status: Legally Separated    Spouse name: N/A  . Number of children: 0  . Years of education: N/A   Occupational History  . homemaker    Social History Main Topics  .  Smoking status: Never Smoker  . Smokeless tobacco: Never Used  . Alcohol use 0.0 oz/week     Comment: social  . Drug use: No  . Sexual activity: Yes    Birth control/ protection: None   Other Topics Concern  . Not on file   Social History Narrative  . No narrative on file     No Known Allergies   Outpatient Medications Prior to Visit  Medication Sig Dispense Refill  . albuterol (PROVENTIL HFA;VENTOLIN  HFA) 108 (90 BASE) MCG/ACT inhaler Inhale 2 puffs into the lungs every 6 (six) hours as needed for wheezing. Use with spacer 1 Inhaler 0  . Cholecalciferol (VITAMIN D3 MAXIMUM STRENGTH PO) Take 7,000 Units by mouth daily.    . fluticasone (FLOVENT HFA) 110 MCG/ACT inhaler Inhale 2 puffs into the lungs daily as needed (asthma).     . montelukast (SINGULAIR) 10 MG tablet Take 10 mg by mouth at bedtime.    . fluticasone (FLONASE) 50 MCG/ACT nasal spray Place 1 spray into the nose daily.     Marland Kitchen ALPRAZolam (XANAX) 0.5 MG tablet Take 0.5 mg by mouth as needed for anxiety or sleep.    . budesonide (PULMICORT) 0.5 MG/2ML nebulizer solution INHALE 2 MLS TWICE A DAY INHALATION 30 DAYS  3  . magnesium aspartate (MAGINEX) 615 MG tablet Take 615 mg by mouth daily.     Marland Kitchen nystatin (MYCOSTATIN) 500000 units TABS tablet   2  . Probiotic Product (ALIGN PO) Take 1 tablet by mouth daily.    . Sulfacetamide Sodium-Sulfur 10-5 % EMUL WASH 1-2 TIMES A DAY  1   No facility-administered medications prior to visit.         Objective:   Physical Exam Vitals:   09/01/15 1445  BP: 134/90  BP Location: Left Arm  Cuff Size: Normal  Pulse: 70  SpO2: 99%  Weight: 174 lb (78.9 kg)  Height: 5\' 9"  (1.753 m)  Gen: Pleasant, well-nourished, in no distress,  normal affect  ENT: No lesions,  mouth clear,  oropharynx clear, no postnasal drip  Neck: No JVD, no TMG, no carotid bruits  Lungs: No use of accessory muscles, clear without rales or rhonchi  Cardiovascular: RRR, heart sounds normal, no murmur or gallops, no peripheral edema  Musculoskeletal: No deformities, no cyanosis or clubbing  Neuro: alert, non focal  Skin: Warm, no lesions or rashes     Assessment & Plan:  Asthma in adult Reestablish care for history of mild intermittent, often Exercise or cold air induced asthma. She has been more symptomatic lately and poor sensitive to workouts, cold air. She has been on a decreased dose of Flovent, and most  recently has been only rarely using it. I think at this point I would like to recheck pulmonary function with a methacholine challenge to compare her spirometry was last time and also to establish true hyperresponsiveness. Given her increased dyspnea we will also check a chest x-ray. We will stop the Flovent until these studies have been done. She'll continue to have albuterol to use as needed. Continue Singulair and loratadine  Baltazar Apo, MD, PhD 09/01/2015, 3:15 PM [facility] [phone] or if no answer [phone]

## 2015-09-03 ENCOUNTER — Encounter (INDEPENDENT_AMBULATORY_CARE_PROVIDER_SITE_OTHER): Payer: BLUE CROSS/BLUE SHIELD | Admitting: Podiatry

## 2015-09-03 NOTE — Progress Notes (Signed)
This encounter was created in error - please disregard.

## 2015-09-08 DIAGNOSIS — F4325 Adjustment disorder with mixed disturbance of emotions and conduct: Secondary | ICD-10-CM | POA: Diagnosis not present

## 2015-09-09 ENCOUNTER — Ambulatory Visit (HOSPITAL_COMMUNITY)
Admission: RE | Admit: 2015-09-09 | Discharge: 2015-09-09 | Disposition: A | Discharge status: Home / Self Care | Payer: BLUE CROSS/BLUE SHIELD | Source: Ambulatory Visit | Attending: Emergency Medicine | Admitting: Emergency Medicine

## 2015-09-09 DIAGNOSIS — J4540 Moderate persistent asthma, uncomplicated: Secondary | ICD-10-CM | POA: Diagnosis not present

## 2015-09-09 LAB — PULMONARY FUNCTION TEST
FEF 25-75 POST: 2.54 L/s
FEF 25-75 Pre: 2.58 L/sec
FEF2575-%Change-Post: -1 %
FEF2575-%Pred-Post: 85 %
FEF2575-%Pred-Pre: 86 %
FEV1-%Change-Post: 0 %
FEV1-%PRED-POST: 105 %
FEV1-%Pred-Pre: 105 %
FEV1-POST: 3.35 L
FEV1-Pre: 3.35 L
FEV1FVC-%Change-Post: 2 %
FEV1FVC-%PRED-PRE: 91 %
FEV6-%CHANGE-POST: 0 %
FEV6-%PRED-POST: 112 %
FEV6-%Pred-Pre: 112 %
FEV6-POST: 4.4 L
FEV6-Pre: 4.42 L
FEV6FVC-%CHANGE-POST: 1 %
FEV6FVC-%Pred-Post: 101 %
FEV6FVC-%Pred-Pre: 99 %
FVC-%Change-Post: -2 %
FVC-%Pred-Post: 110 %
FVC-%Pred-Pre: 113 %
FVC-Post: 4.45 L
FVC-Pre: 4.58 L
PRE FEV6/FVC RATIO: 97 %
Post FEV1/FVC ratio: 75 %
Post FEV6/FVC ratio: 99 %
Pre FEV1/FVC ratio: 73 %

## 2015-09-09 MED ORDER — METHACHOLINE 0.25 MG/ML NEB SOLN
2.0000 mL | Freq: Once | RESPIRATORY_TRACT | Status: AC
  Administered 2015-09-09: 0.5 mg via RESPIRATORY_TRACT

## 2015-09-09 MED ORDER — SODIUM CHLORIDE 0.9 % IN NEBU
3.0000 mL | INHALATION_SOLUTION | Freq: Once | RESPIRATORY_TRACT | Status: AC
  Administered 2015-09-09: 3 mL via RESPIRATORY_TRACT

## 2015-09-09 MED ORDER — METHACHOLINE 0.0625 MG/ML NEB SOLN
2.0000 mL | Freq: Once | RESPIRATORY_TRACT | Status: AC
  Administered 2015-09-09: 0.125 mg via RESPIRATORY_TRACT

## 2015-09-09 MED ORDER — METHACHOLINE 16 MG/ML NEB SOLN
2.0000 mL | Freq: Once | RESPIRATORY_TRACT | Status: AC
  Administered 2015-09-09: 32 mg via RESPIRATORY_TRACT

## 2015-09-09 MED ORDER — METHACHOLINE 4 MG/ML NEB SOLN
2.0000 mL | Freq: Once | RESPIRATORY_TRACT | Status: AC
  Administered 2015-09-09: 8 mg via RESPIRATORY_TRACT

## 2015-09-09 MED ORDER — METHACHOLINE 1 MG/ML NEB SOLN
2.0000 mL | Freq: Once | RESPIRATORY_TRACT | Status: AC
  Administered 2015-09-09: 2 mg via RESPIRATORY_TRACT

## 2015-09-09 MED ORDER — ALBUTEROL SULFATE (2.5 MG/3ML) 0.083% IN NEBU
2.5000 mg | INHALATION_SOLUTION | Freq: Once | RESPIRATORY_TRACT | Status: AC
  Administered 2015-09-09: 2.5 mg via RESPIRATORY_TRACT

## 2015-09-16 ENCOUNTER — Ambulatory Visit (INDEPENDENT_AMBULATORY_CARE_PROVIDER_SITE_OTHER): Payer: BLUE CROSS/BLUE SHIELD | Admitting: Emergency Medicine

## 2015-09-16 ENCOUNTER — Encounter: Payer: Self-pay | Admitting: Emergency Medicine

## 2015-09-16 DIAGNOSIS — J9801 Acute bronchospasm: Secondary | ICD-10-CM | POA: Diagnosis not present

## 2015-09-16 DIAGNOSIS — J4520 Mild intermittent asthma, uncomplicated: Secondary | ICD-10-CM

## 2015-09-16 MED ORDER — ALBUTEROL SULFATE HFA 108 (90 BASE) MCG/ACT IN AERS: 2.0000 | INHALATION_SPRAY | Freq: Four times a day (QID) | RESPIRATORY_TRACT | 0 refills | Status: DC | PRN

## 2015-09-16 MED ORDER — MONTELUKAST SODIUM 10 MG PO TABS: 10.0000 mg | ORAL_TABLET | Freq: Every day | ORAL | 6 refills | Status: DC

## 2015-09-16 MED ORDER — ALBUTEROL SULFATE HFA 108 (90 BASE) MCG/ACT IN AERS: 2.0000 | INHALATION_SPRAY | Freq: Four times a day (QID) | RESPIRATORY_TRACT | 6 refills | Status: AC | PRN

## 2015-09-16 NOTE — Progress Notes (Signed)
Subjective:    Patient ID: Caitlyn Pruitt, female    DOB: 07/26/1965, 50 y.o.   MRN: [id number]  Asthma  She complains of cough and shortness of breath. There is no wheezing. Pertinent negatives include no ear pain, fever, headaches, postnasal drip, rhinorrhea, sneezing, sore throat or trouble swallowing. Her past medical history is significant for asthma.   51 yo never smoker, hx of IBS, allergic rhinitis, asthma dx by Dr Harold Hedge in march '14. She has family hx of COPD and asthma.  She reports URI sx last Fall, has never really felt well since. Has experienced wheeze, cough. Has identified triggers of cold air, some fumes, exercise, possibly GERD. Treated with pred + abx. Was evaluated by Dr Sharlett Iles, underwent spirometry. Has been treated with atrovent/pulmocort nebs not currently, flovent since March. She has noted a clinical response to albuterol.  She is on fluticasone, singulair.   ROV 08/29/12 -- follows up for dyspnea, allergies, suspected asthma. Suspected contributions from allergies and GERD (not currently treated). She is off singulair, flovent. She has been on fluticasone. 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 She continues to cough. Uses and benefits from albuterol before exercise.   ROV 09/01/15 -- Patient is here to reestablish care. She's not been seen in 3 years. She has a history of allergies and possible asthma. Her pulmonary function testing from August 2014 show Some possible modest reduction based on the curve of her flow volume loop.. No bronchodilator response. She reports that last August she had some weakness and dysphasia, was found to have candida. She was on higher dose flovent at the time, currently on 57mcg 2 puffs qd. Also on singulair. She uses albuterol more lately, but acknowledges that she is not reliable with her Flovent. She had some GI sx - nausea, diarrhea last week. Was started back empirically on nystatin. Still feels some airway irritation, mucous  production.   ROV 09/16/15 -- This follow-up visit for allergic rhinitis and suspected asthma. She has been more symptomatic in recent weeks with exercise and with cold air. Difficult to tease out whether this was true hyperresponsiveness versus upper airway symptoms. A methacholine challenge was performed 09/09/15 that did not show evidence for airways hyper responsiveness.    Review of Systems  Constitutional: Negative for fever and unexpected weight change.  HENT: Negative for congestion, dental problem, ear pain, nosebleeds, postnasal drip, rhinorrhea, sinus pressure, sneezing, sore throat and trouble swallowing.   Eyes: Negative for redness and itching.  Respiratory: Positive for cough and shortness of breath. Negative for chest tightness and wheezing.   Cardiovascular: Negative for palpitations and leg swelling.  Gastrointestinal: Negative for nausea and vomiting.  Genitourinary: Negative for dysuria.  Musculoskeletal: Negative for joint swelling.  Skin: Negative for rash.  Neurological: Negative for headaches.  Hematological: Does not bruise/bleed easily.  Psychiatric/Behavioral: Negative for dysphoric mood. The patient is not nervous/anxious.    Past Medical History:  Diagnosis Date  . Abnormal Pap smear   . Asthma 02/19/2012  . Fibroid   . HPV test positive   . Irritable bowel syndrome (IBS)      Family History  Problem Relation Age of Onset  . Heart disease Mother   . Heart disease Father   . Heart disease Sister   . Heart disease Brother   . Emphysema Father   . Emphysema Mother   . Asthma Mother   . Cancer Maternal Grandfather     leukemia  . Cancer Mother  squamous cell skin cancer     Social History   Social History  . Marital status: Legally Separated    Spouse name: N/A  . Number of children: 0  . Years of education: N/A   Occupational History  . homemaker    Social History Main Topics  . Smoking status: Never Smoker  . Smokeless tobacco: Never  Used  . Alcohol use 0.0 oz/week     Comment: social  . Drug use: No  . Sexual activity: Yes    Birth control/ protection: None   Other Topics Concern  . Not on file   Social History Narrative  . No narrative on file     No Known Allergies   Outpatient Medications Prior to Visit  Medication Sig Dispense Refill  . albuterol (PROVENTIL HFA;VENTOLIN HFA) 108 (90 BASE) MCG/ACT inhaler Inhale 2 puffs into the lungs every 6 (six) hours as needed for wheezing. Use with spacer 1 Inhaler 0  . Cholecalciferol (VITAMIN D3 MAXIMUM STRENGTH PO) Take 7,000 Units by mouth daily.    . fluticasone (FLOVENT HFA) 110 MCG/ACT inhaler Inhale 2 puffs into the lungs daily as needed (asthma).     . montelukast (SINGULAIR) 10 MG tablet Take 10 mg by mouth at bedtime.    Marland Kitchen buPROPion (ZYBAN) 150 MG 12 hr tablet Take 150 mg by mouth 2 (two) times daily.     No facility-administered medications prior to visit.         Objective:   Physical Exam Vitals:   09/16/15 1511  BP: 108/80  BP Location: Left Arm  Cuff Size: Normal  Pulse: 63  SpO2: 96%  Height: 5\' 9"  (1.753 m)  Gen: Pleasant, well-nourished, in no distress,  normal affect  ENT: No lesions,  mouth clear,  oropharynx clear, no postnasal drip  Neck: No JVD, no TMG, no carotid bruits  Lungs: No use of accessory muscles, clear without rales or rhonchi  Cardiovascular: RRR, heart sounds normal, no murmur or gallops, no peripheral edema  Musculoskeletal: No deformities, no cyanosis or clubbing  Neuro: alert, non focal  Skin: Warm, no lesions or rashes     Assessment & Plan:  Asthma in adult Methacholine challenge is reassuring without any significant hyperresponsiveness. We had a good discussion about the pros and cons of staying on Flovent in this setting. In the end of believe she should stay off the Flovent ( she's been off for 2 months). Keep albuterol available to use as needed. Continue to treat her allergy symptoms with Singulair  and loratadine  Baltazar Apo, MD, PhD 09/16/2015, 3:42 PM [facility] [phone] or if no answer [phone]

## 2015-09-16 NOTE — Addendum Note (Signed)
Addended by: Virl Cagey on: 09/16/2015 03:45 PM   Modules accepted: Orders

## 2015-09-16 NOTE — Assessment & Plan Note (Signed)
Methacholine challenge is reassuring without any significant hyperresponsiveness. We had a good discussion about the pros and cons of staying on Flovent in this setting. In the end of believe she should stay off the Flovent ( she's been off for 2 months). Keep albuterol available to use as needed. Continue to treat her allergy symptoms with Singulair and loratadine

## 2015-09-16 NOTE — Patient Instructions (Addendum)
Keep albuterol available to use 2 puffs up to every 4 hours if needed for shortness of breath.  I would not restart Flovent at this time.  Continue singulair, loratadine as you are taking them  Follow with Dr Lamonte Sakai in 12 months or sooner if you have any problems

## 2015-09-24 ENCOUNTER — Ambulatory Visit (INDEPENDENT_AMBULATORY_CARE_PROVIDER_SITE_OTHER): Payer: BLUE CROSS/BLUE SHIELD | Admitting: Podiatry

## 2015-09-24 ENCOUNTER — Encounter: Payer: Self-pay | Admitting: Podiatry

## 2015-09-24 VITALS — BP 151/89 | HR 67 | Resp 18

## 2015-09-24 DIAGNOSIS — M722 Plantar fascial fibromatosis: Secondary | ICD-10-CM | POA: Diagnosis not present

## 2015-09-24 DIAGNOSIS — M205X1 Other deformities of toe(s) (acquired), right foot: Secondary | ICD-10-CM

## 2015-09-24 DIAGNOSIS — F4325 Adjustment disorder with mixed disturbance of emotions and conduct: Secondary | ICD-10-CM | POA: Diagnosis not present

## 2015-09-24 NOTE — Patient Instructions (Signed)
Hallux Rigidus Hallux rigidus is a condition involving pain and a loss of motion of the first (big) toe. The pain gets worse with lifting up (extension) of the toe. This is usually due to arthritic bony bumps (spurring) of the joint at the base of the big toe.  SYMPTOMS   Pain, with lifting up of the toe.  Tenderness over the joint where the big toe meets the foot.  Redness, swelling, and warmth over the top of the base of the big toe (sometimes).  Foot pain, stiffness, and limping. CAUSES  Hallux rigidus is caused by arthritis of the joint where the big toe meets the foot. The arthritis creates a bone spur that pinches the soft tissues when the toe is extended. RISK INCREASES WITH:  Tight shoes with a narrow toe box.  Family history of foot problems.  Gout and rheumatoid and psoriatic arthritis.  History of previous toe injury, including "turf toe."  Long first toe, flat feet, and other big toe bony bumps.  Arthritis of the big toe. PREVENTION   Wear wide-toed shoes that fit well.  Tape the big toe to reduce motion and to prevent pinching of the tissues between the bone.  Maintain physical fitness:  Foot and ankle flexibility.  Muscle strength and endurance. PROGNOSIS  This condition can usually be managed with proper treatment. However, surgery is typically required to prevent the problem from recurring.  RELATED COMPLICATIONS  Injury to other areas of the foot or ankle, caused by abnormal walking in an attempt to avoid the pain felt when walking normally. TREATMENT Treatment first involves stopping the activities that aggravate your symptoms. Ice and medicine can be used to reduce the pain and inflammation. Modifications to shoes may help reduce pain, including wearing stiff-soled shoes, shoes with a wide toe box, inserting a padded donut to relieve pressure on top of the joint, or wearing an arch support. Corticosteroid injections may be given to reduce inflammation. If  nonsurgical treatment is unsuccessful, surgery may be needed. Surgical options include removing the arthritic bony spur, cutting a bone in the foot to change the arc of motion (allowing the toe to extend more), or fusion of the joint (eliminating all motion in the joint at the base of the big toe).  MEDICATION   If pain medicine is needed, nonsteroidal anti-inflammatory medicines (aspirin and ibuprofen), or other minor pain relievers (acetaminophen), are often advised.  Do not take pain medicine for 7 days before surgery.  Prescription pain relievers are usually prescribed only after surgery. Use only as directed and only as much as you need.  Ointments for arthritis, applied to the skin, may give some relief.  Injections of corticosteroids may be given to reduce inflammation. HEAT AND COLD  Cold treatment (icing) relieves pain and reduces inflammation. Cold treatment should be applied for 10 to 15 minutes every 2 to 3 hours, and immediately after activity that aggravates your symptoms. Use ice packs or an ice massage.  Heat treatment may be used before performing the stretching and strengthening activities prescribed by your caregiver, physical therapist, or athletic trainer. Use a heat pack or a warm water soak. SEEK MEDICAL CARE IF:   Symptoms get worse or do not improve in 2 weeks, despite treatment.  After surgery you develop fever, increasing pain, redness, swelling, drainage of fluids, bleeding, or increasing warmth.  New, unexplained symptoms develop. (Drugs used in treatment may produce side effects.)   This information is not intended to replace advice given to   you by your health care provider. Make sure you discuss any questions you have with your health care provider.   Document Released: 12/28/2004 Document Revised: 01/18/2014 Document Reviewed: 04/11/2008 Elsevier Interactive Patient Education 2016 [employer]  

## 2015-09-24 NOTE — Progress Notes (Signed)
   Subjective:    Patient ID: Caitlyn Pruitt, female    DOB: 31-May-1965, 50 y.o.   MRN: [id number]  HPI I  This patient presents today complaining of a painful right great toe joint when walking and standing with physical activity. She has had several opinions about possible surgical treatment including fusion, joint implant, or cheilectomy. Patient also describes local steroid injection into the right great toe joint by orthopedic doctor which provided slight temporary relief. At this time patient is not interested in surgical option would like to know if there is any conservative option at this time. Also, patient complains of occasional arch pain which she controls with her existing rigid orthotics which she has worn for many years in her athletic style shoes.  Patient is physical therapist  Review of Systems  All other systems reviewed and are negative.      Objective:   Physical Exam  Orientated 3  Vascular: No calf edema or calf tenderness bilaterally No peripheral edema bilaterally DP and PT pulses 2/4 bilaterally Capillary reflex immediate bilaterally  Neurological: Sensation to 10 g monofilament wire intact 5/5 bilaterally Vibratory sensation intact bilaterally Ankle reflex equal and reactive bilaterally  Dermatological: No skin lesions noted bilaterally Texture and turgor within normal limits bilaterally  Musculoskeletal: HAV deformities bilaterally Restricted range of motion right first MPJ with tenderness on on and range of motion Mild palpable tenderness plantar fascial insertional areas bilaterally without any palpable lesions Fat-pad adequate bilaterally      Assessment & Plan:   Assessment: Satisfactory neurovascular status Hallux limitus rigidus right first MPJ Plantar fasciitis bilaterally  Plan: Discussed conservative treatment options as well as surgical treatment options for hallux limitus right Patient at this time would opt for conservative  care described and orthotic which would have a turf toe extension on the right as well as accommodate the plantar fasciitis  Digital scan obtained today for accommodative orthotic full length with turf toe extension right Fiber carbon insert dispensed 1 to wear in athletic style shoe right, until accommodative orthotics arrive  Reappoint 3 weeks

## 2015-10-01 DIAGNOSIS — H15101 Unspecified episcleritis, right eye: Secondary | ICD-10-CM | POA: Diagnosis not present

## 2015-10-01 DIAGNOSIS — F4325 Adjustment disorder with mixed disturbance of emotions and conduct: Secondary | ICD-10-CM | POA: Diagnosis not present

## 2015-10-09 DIAGNOSIS — Z13820 Encounter for screening for osteoporosis: Secondary | ICD-10-CM | POA: Diagnosis not present

## 2015-10-19 DIAGNOSIS — Z23 Encounter for immunization: Secondary | ICD-10-CM | POA: Diagnosis not present

## 2015-10-21 ENCOUNTER — Ambulatory Visit: Payer: BLUE CROSS/BLUE SHIELD | Admitting: *Deleted

## 2015-10-21 DIAGNOSIS — M722 Plantar fascial fibromatosis: Secondary | ICD-10-CM

## 2015-10-21 NOTE — Progress Notes (Signed)
Patient ID: Caitlyn Pruitt, female   DOB: [birth year], 50 y.o.   MRN: [id number]  Patient presents for orthotic pick up.  Verbal and written break in and wear instructions given.  Patient will follow up in 4 weeks if symptoms worsen or fail to improve.

## 2015-10-21 NOTE — Patient Instructions (Signed)

## 2015-10-22 ENCOUNTER — Ambulatory Visit: Payer: BLUE CROSS/BLUE SHIELD | Admitting: Podiatry

## 2015-10-24 DIAGNOSIS — H01003 Unspecified blepharitis right eye, unspecified eyelid: Secondary | ICD-10-CM | POA: Diagnosis not present

## 2015-10-28 DIAGNOSIS — J310 Chronic rhinitis: Secondary | ICD-10-CM | POA: Diagnosis not present

## 2015-10-28 DIAGNOSIS — R062 Wheezing: Secondary | ICD-10-CM | POA: Diagnosis not present

## 2015-10-28 DIAGNOSIS — K219 Gastro-esophageal reflux disease without esophagitis: Secondary | ICD-10-CM | POA: Diagnosis not present

## 2015-10-28 DIAGNOSIS — J4540 Moderate persistent asthma, uncomplicated: Secondary | ICD-10-CM | POA: Diagnosis not present

## 2015-10-30 DIAGNOSIS — N760 Acute vaginitis: Secondary | ICD-10-CM | POA: Diagnosis not present

## 2015-11-17 DIAGNOSIS — H11151 Pinguecula, right eye: Secondary | ICD-10-CM | POA: Diagnosis not present

## 2015-12-02 DIAGNOSIS — M19012 Primary osteoarthritis, left shoulder: Secondary | ICD-10-CM | POA: Diagnosis not present

## 2015-12-02 DIAGNOSIS — M25512 Pain in left shoulder: Secondary | ICD-10-CM | POA: Diagnosis not present

## 2015-12-03 DIAGNOSIS — M94212 Chondromalacia, left shoulder: Secondary | ICD-10-CM | POA: Diagnosis not present

## 2015-12-03 DIAGNOSIS — M25412 Effusion, left shoulder: Secondary | ICD-10-CM | POA: Diagnosis not present

## 2015-12-03 DIAGNOSIS — M75112 Incomplete rotator cuff tear or rupture of left shoulder, not specified as traumatic: Secondary | ICD-10-CM | POA: Diagnosis not present

## 2015-12-03 DIAGNOSIS — M19012 Primary osteoarthritis, left shoulder: Secondary | ICD-10-CM | POA: Diagnosis not present

## 2015-12-08 DIAGNOSIS — R300 Dysuria: Secondary | ICD-10-CM | POA: Diagnosis not present

## 2015-12-08 DIAGNOSIS — B373 Candidiasis of vulva and vagina: Secondary | ICD-10-CM | POA: Diagnosis not present

## 2015-12-08 DIAGNOSIS — N390 Urinary tract infection, site not specified: Secondary | ICD-10-CM | POA: Diagnosis not present

## 2015-12-18 DIAGNOSIS — Z124 Encounter for screening for malignant neoplasm of cervix: Secondary | ICD-10-CM | POA: Diagnosis not present

## 2015-12-18 DIAGNOSIS — R87619 Unspecified abnormal cytological findings in specimens from cervix uteri: Secondary | ICD-10-CM | POA: Diagnosis not present

## 2015-12-26 DIAGNOSIS — M94212 Chondromalacia, left shoulder: Secondary | ICD-10-CM | POA: Diagnosis not present

## 2015-12-26 DIAGNOSIS — M7552 Bursitis of left shoulder: Secondary | ICD-10-CM | POA: Diagnosis not present

## 2015-12-26 DIAGNOSIS — M75112 Incomplete rotator cuff tear or rupture of left shoulder, not specified as traumatic: Secondary | ICD-10-CM | POA: Diagnosis not present

## 2015-12-26 DIAGNOSIS — M7542 Impingement syndrome of left shoulder: Secondary | ICD-10-CM | POA: Diagnosis not present

## 2015-12-26 DIAGNOSIS — G8918 Other acute postprocedural pain: Secondary | ICD-10-CM | POA: Diagnosis not present

## 2015-12-26 DIAGNOSIS — M19012 Primary osteoarthritis, left shoulder: Secondary | ICD-10-CM | POA: Diagnosis not present

## 2015-12-26 DIAGNOSIS — M24112 Other articular cartilage disorders, left shoulder: Secondary | ICD-10-CM | POA: Diagnosis not present

## 2015-12-30 DIAGNOSIS — M19012 Primary osteoarthritis, left shoulder: Secondary | ICD-10-CM | POA: Diagnosis not present

## 2015-12-30 DIAGNOSIS — M7542 Impingement syndrome of left shoulder: Secondary | ICD-10-CM | POA: Diagnosis not present

## 2015-12-30 DIAGNOSIS — M75112 Incomplete rotator cuff tear or rupture of left shoulder, not specified as traumatic: Secondary | ICD-10-CM | POA: Diagnosis not present

## 2016-01-01 DIAGNOSIS — M25512 Pain in left shoulder: Secondary | ICD-10-CM | POA: Diagnosis not present

## 2016-01-01 DIAGNOSIS — M75112 Incomplete rotator cuff tear or rupture of left shoulder, not specified as traumatic: Secondary | ICD-10-CM | POA: Diagnosis not present

## 2016-01-01 DIAGNOSIS — M19012 Primary osteoarthritis, left shoulder: Secondary | ICD-10-CM | POA: Diagnosis not present

## 2016-01-01 DIAGNOSIS — M7542 Impingement syndrome of left shoulder: Secondary | ICD-10-CM | POA: Diagnosis not present

## 2016-01-06 DIAGNOSIS — M7542 Impingement syndrome of left shoulder: Secondary | ICD-10-CM | POA: Diagnosis not present

## 2016-01-06 DIAGNOSIS — M75112 Incomplete rotator cuff tear or rupture of left shoulder, not specified as traumatic: Secondary | ICD-10-CM | POA: Diagnosis not present

## 2016-01-06 DIAGNOSIS — M19012 Primary osteoarthritis, left shoulder: Secondary | ICD-10-CM | POA: Diagnosis not present

## 2016-01-14 DIAGNOSIS — H01009 Unspecified blepharitis unspecified eye, unspecified eyelid: Secondary | ICD-10-CM | POA: Diagnosis not present

## 2016-01-14 DIAGNOSIS — H16223 Keratoconjunctivitis sicca, not specified as Sjogren's, bilateral: Secondary | ICD-10-CM | POA: Diagnosis not present

## 2016-01-17 DIAGNOSIS — H16223 Keratoconjunctivitis sicca, not specified as Sjogren's, bilateral: Secondary | ICD-10-CM | POA: Diagnosis not present

## 2016-01-17 DIAGNOSIS — H01009 Unspecified blepharitis unspecified eye, unspecified eyelid: Secondary | ICD-10-CM | POA: Diagnosis not present

## 2016-01-19 DIAGNOSIS — Z0000 Encounter for general adult medical examination without abnormal findings: Secondary | ICD-10-CM | POA: Diagnosis not present

## 2016-01-19 DIAGNOSIS — D6489 Other specified anemias: Secondary | ICD-10-CM | POA: Diagnosis not present

## 2016-01-22 DIAGNOSIS — M19012 Primary osteoarthritis, left shoulder: Secondary | ICD-10-CM | POA: Diagnosis not present

## 2016-01-23 DIAGNOSIS — K589 Irritable bowel syndrome without diarrhea: Secondary | ICD-10-CM | POA: Diagnosis not present

## 2016-01-23 DIAGNOSIS — H04123 Dry eye syndrome of bilateral lacrimal glands: Secondary | ICD-10-CM | POA: Diagnosis not present

## 2016-01-23 DIAGNOSIS — J45998 Other asthma: Secondary | ICD-10-CM | POA: Diagnosis not present

## 2016-01-23 DIAGNOSIS — Z1389 Encounter for screening for other disorder: Secondary | ICD-10-CM | POA: Diagnosis not present

## 2016-01-23 DIAGNOSIS — F418 Other specified anxiety disorders: Secondary | ICD-10-CM | POA: Diagnosis not present

## 2016-01-23 DIAGNOSIS — Z0000 Encounter for general adult medical examination without abnormal findings: Secondary | ICD-10-CM | POA: Diagnosis not present

## 2016-02-04 DIAGNOSIS — M25512 Pain in left shoulder: Secondary | ICD-10-CM | POA: Diagnosis not present

## 2016-02-04 DIAGNOSIS — S43432D Superior glenoid labrum lesion of left shoulder, subsequent encounter: Secondary | ICD-10-CM | POA: Diagnosis not present

## 2016-02-04 DIAGNOSIS — M25612 Stiffness of left shoulder, not elsewhere classified: Secondary | ICD-10-CM | POA: Diagnosis not present

## 2016-02-04 DIAGNOSIS — R531 Weakness: Secondary | ICD-10-CM | POA: Diagnosis not present

## 2016-02-25 DIAGNOSIS — F4323 Adjustment disorder with mixed anxiety and depressed mood: Secondary | ICD-10-CM | POA: Diagnosis not present

## 2016-02-26 DIAGNOSIS — M19012 Primary osteoarthritis, left shoulder: Secondary | ICD-10-CM | POA: Diagnosis not present

## 2016-03-03 DIAGNOSIS — F4323 Adjustment disorder with mixed anxiety and depressed mood: Secondary | ICD-10-CM | POA: Diagnosis not present

## 2016-03-10 DIAGNOSIS — N760 Acute vaginitis: Secondary | ICD-10-CM | POA: Diagnosis not present

## 2016-03-25 DIAGNOSIS — K589 Irritable bowel syndrome without diarrhea: Secondary | ICD-10-CM | POA: Diagnosis not present

## 2016-03-25 DIAGNOSIS — F4323 Adjustment disorder with mixed anxiety and depressed mood: Secondary | ICD-10-CM | POA: Diagnosis not present

## 2016-03-25 DIAGNOSIS — G4700 Insomnia, unspecified: Secondary | ICD-10-CM | POA: Diagnosis not present

## 2016-03-25 DIAGNOSIS — R5383 Other fatigue: Secondary | ICD-10-CM | POA: Diagnosis not present

## 2016-03-25 DIAGNOSIS — E559 Vitamin D deficiency, unspecified: Secondary | ICD-10-CM | POA: Diagnosis not present

## 2016-03-25 DIAGNOSIS — E039 Hypothyroidism, unspecified: Secondary | ICD-10-CM | POA: Diagnosis not present

## 2016-03-25 DIAGNOSIS — N943 Premenstrual tension syndrome: Secondary | ICD-10-CM | POA: Diagnosis not present

## 2016-03-31 DIAGNOSIS — Z9889 Other specified postprocedural states: Secondary | ICD-10-CM | POA: Diagnosis not present

## 2016-03-31 DIAGNOSIS — H01009 Unspecified blepharitis unspecified eye, unspecified eyelid: Secondary | ICD-10-CM | POA: Diagnosis not present

## 2016-03-31 DIAGNOSIS — H16223 Keratoconjunctivitis sicca, not specified as Sjogren's, bilateral: Secondary | ICD-10-CM | POA: Diagnosis not present

## 2016-03-31 DIAGNOSIS — H11152 Pinguecula, left eye: Secondary | ICD-10-CM | POA: Diagnosis not present

## 2016-04-01 DIAGNOSIS — F4323 Adjustment disorder with mixed anxiety and depressed mood: Secondary | ICD-10-CM | POA: Diagnosis not present

## 2016-04-04 ENCOUNTER — Other Ambulatory Visit: Payer: Self-pay | Admitting: Emergency Medicine

## 2016-04-08 DIAGNOSIS — F4323 Adjustment disorder with mixed anxiety and depressed mood: Secondary | ICD-10-CM | POA: Diagnosis not present

## 2016-04-22 DIAGNOSIS — F4323 Adjustment disorder with mixed anxiety and depressed mood: Secondary | ICD-10-CM | POA: Diagnosis not present

## 2016-05-04 DIAGNOSIS — J4540 Moderate persistent asthma, uncomplicated: Secondary | ICD-10-CM | POA: Diagnosis not present

## 2016-05-04 DIAGNOSIS — J310 Chronic rhinitis: Secondary | ICD-10-CM | POA: Diagnosis not present

## 2016-05-04 DIAGNOSIS — K219 Gastro-esophageal reflux disease without esophagitis: Secondary | ICD-10-CM | POA: Diagnosis not present

## 2016-05-10 DIAGNOSIS — Z0142 Encounter for cervical smear to confirm findings of recent normal smear following initial abnormal smear: Secondary | ICD-10-CM | POA: Diagnosis not present

## 2016-05-10 DIAGNOSIS — R87810 Cervical high risk human papillomavirus (HPV) DNA test positive: Secondary | ICD-10-CM | POA: Diagnosis not present

## 2016-05-12 DIAGNOSIS — F4323 Adjustment disorder with mixed anxiety and depressed mood: Secondary | ICD-10-CM | POA: Diagnosis not present

## 2016-05-31 DIAGNOSIS — H16223 Keratoconjunctivitis sicca, not specified as Sjogren's, bilateral: Secondary | ICD-10-CM | POA: Diagnosis not present

## 2016-05-31 DIAGNOSIS — H01009 Unspecified blepharitis unspecified eye, unspecified eyelid: Secondary | ICD-10-CM | POA: Diagnosis not present

## 2016-07-06 DIAGNOSIS — N760 Acute vaginitis: Secondary | ICD-10-CM | POA: Diagnosis not present

## 2016-07-07 DIAGNOSIS — L700 Acne vulgaris: Secondary | ICD-10-CM | POA: Diagnosis not present

## 2016-07-19 DIAGNOSIS — M1711 Unilateral primary osteoarthritis, right knee: Secondary | ICD-10-CM | POA: Diagnosis not present

## 2016-07-21 DIAGNOSIS — H16223 Keratoconjunctivitis sicca, not specified as Sjogren's, bilateral: Secondary | ICD-10-CM | POA: Diagnosis not present

## 2016-07-21 DIAGNOSIS — H01009 Unspecified blepharitis unspecified eye, unspecified eyelid: Secondary | ICD-10-CM | POA: Diagnosis not present

## 2016-07-31 ENCOUNTER — Other Ambulatory Visit: Payer: Self-pay | Admitting: Emergency Medicine

## 2016-07-31 DIAGNOSIS — J9801 Acute bronchospasm: Secondary | ICD-10-CM

## 2016-08-13 DIAGNOSIS — R040 Epistaxis: Secondary | ICD-10-CM | POA: Diagnosis not present

## 2016-08-26 DIAGNOSIS — F4323 Adjustment disorder with mixed anxiety and depressed mood: Secondary | ICD-10-CM | POA: Diagnosis not present

## 2016-09-01 DIAGNOSIS — F4323 Adjustment disorder with mixed anxiety and depressed mood: Secondary | ICD-10-CM | POA: Diagnosis not present

## 2016-09-01 DIAGNOSIS — H16223 Keratoconjunctivitis sicca, not specified as Sjogren's, bilateral: Secondary | ICD-10-CM | POA: Diagnosis not present

## 2016-09-01 DIAGNOSIS — H01009 Unspecified blepharitis unspecified eye, unspecified eyelid: Secondary | ICD-10-CM | POA: Diagnosis not present

## 2016-09-08 DIAGNOSIS — F430 Acute stress reaction: Secondary | ICD-10-CM | POA: Diagnosis not present

## 2016-09-09 DIAGNOSIS — F4323 Adjustment disorder with mixed anxiety and depressed mood: Secondary | ICD-10-CM | POA: Diagnosis not present

## 2016-09-15 DIAGNOSIS — Z13220 Encounter for screening for lipoid disorders: Secondary | ICD-10-CM | POA: Diagnosis not present

## 2016-09-15 DIAGNOSIS — Z01419 Encounter for gynecological examination (general) (routine) without abnormal findings: Secondary | ICD-10-CM | POA: Diagnosis not present

## 2016-09-15 DIAGNOSIS — Z1231 Encounter for screening mammogram for malignant neoplasm of breast: Secondary | ICD-10-CM | POA: Diagnosis not present

## 2016-09-15 DIAGNOSIS — Z1321 Encounter for screening for nutritional disorder: Secondary | ICD-10-CM | POA: Diagnosis not present

## 2016-09-15 DIAGNOSIS — Z13228 Encounter for screening for other metabolic disorders: Secondary | ICD-10-CM | POA: Diagnosis not present

## 2016-09-15 DIAGNOSIS — Z1329 Encounter for screening for other suspected endocrine disorder: Secondary | ICD-10-CM | POA: Diagnosis not present

## 2016-09-20 DIAGNOSIS — E039 Hypothyroidism, unspecified: Secondary | ICD-10-CM | POA: Diagnosis not present

## 2016-09-20 DIAGNOSIS — E559 Vitamin D deficiency, unspecified: Secondary | ICD-10-CM | POA: Diagnosis not present

## 2016-09-20 DIAGNOSIS — F4323 Adjustment disorder with mixed anxiety and depressed mood: Secondary | ICD-10-CM | POA: Diagnosis not present

## 2016-09-20 DIAGNOSIS — R5383 Other fatigue: Secondary | ICD-10-CM | POA: Diagnosis not present

## 2016-09-20 DIAGNOSIS — B379 Candidiasis, unspecified: Secondary | ICD-10-CM | POA: Diagnosis not present

## 2016-09-20 DIAGNOSIS — N951 Menopausal and female climacteric states: Secondary | ICD-10-CM | POA: Diagnosis not present

## 2016-09-21 DIAGNOSIS — F430 Acute stress reaction: Secondary | ICD-10-CM | POA: Diagnosis not present

## 2016-09-22 DIAGNOSIS — K645 Perianal venous thrombosis: Secondary | ICD-10-CM | POA: Diagnosis not present

## 2016-09-24 DIAGNOSIS — F4323 Adjustment disorder with mixed anxiety and depressed mood: Secondary | ICD-10-CM | POA: Diagnosis not present

## 2016-10-06 ENCOUNTER — Other Ambulatory Visit: Payer: Self-pay | Admitting: Emergency Medicine

## 2016-10-08 DIAGNOSIS — Z0000 Encounter for general adult medical examination without abnormal findings: Secondary | ICD-10-CM | POA: Diagnosis not present

## 2016-10-08 DIAGNOSIS — F430 Acute stress reaction: Secondary | ICD-10-CM | POA: Diagnosis not present

## 2016-10-11 DIAGNOSIS — F4323 Adjustment disorder with mixed anxiety and depressed mood: Secondary | ICD-10-CM | POA: Diagnosis not present

## 2016-10-15 DIAGNOSIS — F4323 Adjustment disorder with mixed anxiety and depressed mood: Secondary | ICD-10-CM | POA: Diagnosis not present

## 2016-10-18 ENCOUNTER — Ambulatory Visit: Payer: BLUE CROSS/BLUE SHIELD | Admitting: Emergency Medicine

## 2016-10-22 DIAGNOSIS — F4323 Adjustment disorder with mixed anxiety and depressed mood: Secondary | ICD-10-CM | POA: Diagnosis not present

## 2016-10-26 DIAGNOSIS — N390 Urinary tract infection, site not specified: Secondary | ICD-10-CM | POA: Diagnosis not present

## 2016-10-29 ENCOUNTER — Ambulatory Visit: Payer: BLUE CROSS/BLUE SHIELD | Admitting: Emergency Medicine

## 2016-11-01 DIAGNOSIS — F4323 Adjustment disorder with mixed anxiety and depressed mood: Secondary | ICD-10-CM | POA: Diagnosis not present

## 2016-11-05 ENCOUNTER — Encounter: Payer: Self-pay | Admitting: Emergency Medicine

## 2016-11-05 ENCOUNTER — Ambulatory Visit (INDEPENDENT_AMBULATORY_CARE_PROVIDER_SITE_OTHER): Payer: BLUE CROSS/BLUE SHIELD | Admitting: Emergency Medicine

## 2016-11-05 DIAGNOSIS — Z9109 Other allergy status, other than to drugs and biological substances: Secondary | ICD-10-CM | POA: Diagnosis not present

## 2016-11-05 NOTE — Assessment & Plan Note (Addendum)
So she had allergic rhinitis.  She has improved significantly, is no longer having dyspnea, now that she is on a reliable allergy regimen.  We will continue the same.  She does have albuterol available that she could use if she has episodes of dyspnea especially in the setting of exercise.  Please continue your Singulair, loratadine, fluticasone nasal spray as you are using them.  Keep albuterol available to use as needed for shortness of breath or wheezing.  Follow with Dr Lamonte Sakai in 12 months or sooner if you have any problems

## 2016-11-05 NOTE — Patient Instructions (Addendum)
Please continue your Singulair, loratadine, fluticasone nasal spray as you are using them.  Keep albuterol available to use as needed for shortness of breath or wheezing.  Follow with Dr Lamonte Sakai in 12 months or sooner if you have any problems

## 2016-11-05 NOTE — Progress Notes (Signed)
Subjective:    Patient ID: Caitlyn Pruitt, female    DOB: 1965-12-22, 51 y.o.   MRN: [id number]  Asthma  There is no cough, shortness of breath or wheezing. Pertinent negatives include no ear pain, fever, headaches, postnasal drip, rhinorrhea, sneezing, sore throat or trouble swallowing. Her past medical history is significant for asthma.   51 yo never smoker, hx of IBS, allergic rhinitis, asthma dx by Dr Harold Hedge in march '14. She has family hx of COPD and asthma. She reports URI sx last Fall, has never really felt well since. Has experienced wheeze, cough. Has identified triggers of cold air, some fumes, exercise, possibly GERD. Treated with pred + abx. Was evaluated by Dr Sharlett Iles, underwent spirometry. Has been treated with atrovent/pulmocort nebs not currently, flovent since March. She has noted a clinical response to albuterol.  She is on fluticasone, singulair.   ROV 08/29/12 -- follows up for dyspnea, allergies, suspected asthma. Suspected contributions from allergies and GERD (not currently treated). She is off singulair, flovent. She has been on fluticasone. 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 She continues to cough. Uses and benefits from albuterol before exercise.   ROV 09/01/15 -- Patient is here to reestablish care. She's not been seen in 3 years. She has a history of allergies and possible asthma. Her pulmonary function testing from August 2014 show Some possible modest reduction based on the curve of her flow volume loop.. No bronchodilator response. She reports that last August she had some weakness and dysphasia, was found to have candida. She was on higher dose flovent at the time, currently on 45mcg 2 puffs qd. Also on singulair. She uses albuterol more lately, but acknowledges that she is not reliable with her Flovent. She had some GI sx - nausea, diarrhea last week. Was started back empirically on nystatin. Still feels some airway irritation, mucous production.   ROV  09/16/15 -- This follow-up visit for allergic rhinitis and suspected asthma. She has been more symptomatic in recent weeks with exercise and with cold air. Difficult to tease out whether this was true hyperresponsiveness versus upper airway symptoms. A methacholine challenge was performed 09/09/15 that did not show evidence for airways hyper responsiveness.   ROV 11/05/16 --this is a regular follow-up visit for patient with history of possible asthma versus upper airway irritation syndrome.  She had a reassuring methacholine challenge 09/09/15.  She has exhibited significant sensitivity to exercise and cold air but has been doing quite well since last time on a good allergy routine.  She is currently on Singulair, uses fluticasone nasal spray, loratadine.  She has albuterol to use as needed, has not required it. She is able to exercise, does cardio and interval training. Has not needed her inhaler during exercise.    Review of Systems  Constitutional: Negative for fever and unexpected weight change.  HENT: Negative for congestion, dental problem, ear pain, nosebleeds, postnasal drip, rhinorrhea, sinus pressure, sneezing, sore throat and trouble swallowing.   Eyes: Negative for redness and itching.  Respiratory: Negative for cough, chest tightness, shortness of breath and wheezing.   Cardiovascular: Negative for palpitations and leg swelling.  Gastrointestinal: Negative for nausea and vomiting.  Genitourinary: Negative for dysuria.  Musculoskeletal: Negative for joint swelling.  Skin: Negative for rash.  Neurological: Negative for headaches.  Hematological: Does not bruise/bleed easily.  Psychiatric/Behavioral: Negative for dysphoric mood. The patient is not nervous/anxious.        Objective:   Physical Exam Vitals:  11/05/16 1143 11/05/16 1144  BP:  130/84  Pulse:  64  SpO2:  98%  Weight: 175 lb (79.4 kg)   Height: 5\' 9"  (1.753 m)   Gen: Pleasant, well-nourished, in no distress,  normal  affect  ENT: No lesions,  mouth clear,  oropharynx clear, no postnasal drip  Neck: No JVD, no TMG, no carotid bruits  Lungs: No use of accessory muscles, clear without rales or rhonchi  Cardiovascular: RRR, heart sounds normal, no murmur or gallops, no peripheral edema  Musculoskeletal: No deformities, no cyanosis or clubbing  Neuro: alert, non focal  Skin: Warm, no lesions or rashes     Assessment & Plan:  Environmental allergies So she had allergic rhinitis.  She has improved significantly, is no longer having dyspnea, now that she is on a reliable allergy regimen.  We will continue the same.  She does have albuterol available that she could use if she has episodes of dyspnea especially in the setting of exercise.  Please continue your Singulair, loratadine, fluticasone nasal spray as you are using them.  Keep albuterol available to use as needed for shortness of breath or wheezing.  Follow with Dr Lamonte Sakai in 12 months or sooner if you have any problems  Baltazar Apo, MD, PhD 11/05/2016, 12:01 PM [facility] [phone] or if no answer [phone]

## 2016-11-12 DIAGNOSIS — F4323 Adjustment disorder with mixed anxiety and depressed mood: Secondary | ICD-10-CM | POA: Diagnosis not present

## 2016-11-26 DIAGNOSIS — F4323 Adjustment disorder with mixed anxiety and depressed mood: Secondary | ICD-10-CM | POA: Diagnosis not present

## 2016-12-12 DIAGNOSIS — H02844 Edema of left upper eyelid: Secondary | ICD-10-CM | POA: Diagnosis not present

## 2016-12-12 DIAGNOSIS — H00024 Hordeolum internum left upper eyelid: Secondary | ICD-10-CM | POA: Diagnosis not present

## 2016-12-13 DIAGNOSIS — F4323 Adjustment disorder with mixed anxiety and depressed mood: Secondary | ICD-10-CM | POA: Diagnosis not present

## 2016-12-24 DIAGNOSIS — J4540 Moderate persistent asthma, uncomplicated: Secondary | ICD-10-CM | POA: Diagnosis not present

## 2016-12-24 DIAGNOSIS — J310 Chronic rhinitis: Secondary | ICD-10-CM | POA: Diagnosis not present

## 2016-12-24 DIAGNOSIS — K219 Gastro-esophageal reflux disease without esophagitis: Secondary | ICD-10-CM | POA: Diagnosis not present

## 2016-12-30 DIAGNOSIS — F4323 Adjustment disorder with mixed anxiety and depressed mood: Secondary | ICD-10-CM | POA: Diagnosis not present

## 2017-01-14 DIAGNOSIS — F4323 Adjustment disorder with mixed anxiety and depressed mood: Secondary | ICD-10-CM | POA: Diagnosis not present

## 2017-01-26 DIAGNOSIS — F4323 Adjustment disorder with mixed anxiety and depressed mood: Secondary | ICD-10-CM | POA: Diagnosis not present

## 2017-02-02 DIAGNOSIS — F4323 Adjustment disorder with mixed anxiety and depressed mood: Secondary | ICD-10-CM | POA: Diagnosis not present

## 2017-02-09 DIAGNOSIS — F4323 Adjustment disorder with mixed anxiety and depressed mood: Secondary | ICD-10-CM | POA: Diagnosis not present

## 2017-02-11 DIAGNOSIS — N951 Menopausal and female climacteric states: Secondary | ICD-10-CM | POA: Diagnosis not present

## 2017-02-11 DIAGNOSIS — N760 Acute vaginitis: Secondary | ICD-10-CM | POA: Diagnosis not present

## 2017-02-11 DIAGNOSIS — R87810 Cervical high risk human papillomavirus (HPV) DNA test positive: Secondary | ICD-10-CM | POA: Diagnosis not present

## 2017-02-11 DIAGNOSIS — Z124 Encounter for screening for malignant neoplasm of cervix: Secondary | ICD-10-CM | POA: Diagnosis not present

## 2017-02-22 DIAGNOSIS — F4323 Adjustment disorder with mixed anxiety and depressed mood: Secondary | ICD-10-CM | POA: Diagnosis not present

## 2017-03-02 DIAGNOSIS — F4323 Adjustment disorder with mixed anxiety and depressed mood: Secondary | ICD-10-CM | POA: Diagnosis not present

## 2017-03-03 DIAGNOSIS — N2889 Other specified disorders of kidney and ureter: Secondary | ICD-10-CM | POA: Diagnosis not present

## 2017-03-03 DIAGNOSIS — K5909 Other constipation: Secondary | ICD-10-CM | POA: Diagnosis not present

## 2017-03-03 DIAGNOSIS — G4709 Other insomnia: Secondary | ICD-10-CM | POA: Diagnosis not present

## 2017-03-03 DIAGNOSIS — F418 Other specified anxiety disorders: Secondary | ICD-10-CM | POA: Diagnosis not present

## 2017-03-03 DIAGNOSIS — Z1389 Encounter for screening for other disorder: Secondary | ICD-10-CM | POA: Diagnosis not present

## 2017-07-13 ENCOUNTER — Ambulatory Visit: Payer: BLUE CROSS/BLUE SHIELD | Admitting: Podiatry

## 2019-03-01 ENCOUNTER — Other Ambulatory Visit: Payer: Self-pay | Admitting: Obstetrics and Gynecology

## 2019-03-01 DIAGNOSIS — R928 Other abnormal and inconclusive findings on diagnostic imaging of breast: Secondary | ICD-10-CM

## 2019-03-05 ENCOUNTER — Ambulatory Visit
Admission: RE | Admit: 2019-03-05 | Discharge: 2019-03-05 | Disposition: A | Discharge status: Home / Self Care | Payer: BLUE CROSS/BLUE SHIELD | Source: Ambulatory Visit | Attending: Obstetrics and Gynecology | Admitting: Obstetrics and Gynecology

## 2019-03-05 ENCOUNTER — Other Ambulatory Visit: Payer: Self-pay | Admitting: Obstetrics and Gynecology

## 2019-03-05 ENCOUNTER — Ambulatory Visit
Admission: RE | Admit: 2019-03-05 | Discharge: 2019-03-05 | Disposition: A | Discharge status: Home / Self Care | Payer: No Typology Code available for payment source | Source: Ambulatory Visit | Attending: Obstetrics and Gynecology | Admitting: Obstetrics and Gynecology

## 2019-03-05 ENCOUNTER — Other Ambulatory Visit: Payer: Self-pay

## 2019-03-05 DIAGNOSIS — R928 Other abnormal and inconclusive findings on diagnostic imaging of breast: Secondary | ICD-10-CM

## 2019-03-05 DIAGNOSIS — N6489 Other specified disorders of breast: Secondary | ICD-10-CM

## 2019-03-07 ENCOUNTER — Other Ambulatory Visit: Payer: Self-pay | Admitting: Obstetrics and Gynecology

## 2019-03-07 DIAGNOSIS — N6489 Other specified disorders of breast: Secondary | ICD-10-CM

## 2019-03-09 ENCOUNTER — Other Ambulatory Visit: Payer: Self-pay

## 2019-03-09 ENCOUNTER — Ambulatory Visit
Admission: RE | Admit: 2019-03-09 | Discharge: 2019-03-09 | Disposition: A | Discharge status: Home / Self Care | Payer: No Typology Code available for payment source | Source: Ambulatory Visit | Attending: Obstetrics and Gynecology | Admitting: Obstetrics and Gynecology

## 2019-03-09 DIAGNOSIS — N6489 Other specified disorders of breast: Secondary | ICD-10-CM

## 2019-09-03 ENCOUNTER — Other Ambulatory Visit: Payer: Self-pay | Admitting: Obstetrics and Gynecology

## 2019-09-03 ENCOUNTER — Other Ambulatory Visit: Payer: Self-pay

## 2019-09-03 ENCOUNTER — Ambulatory Visit
Admission: RE | Admit: 2019-09-03 | Discharge: 2019-09-03 | Disposition: A | Discharge status: Home / Self Care | Payer: 59 | Source: Ambulatory Visit | Attending: Obstetrics and Gynecology | Admitting: Obstetrics and Gynecology

## 2019-09-03 DIAGNOSIS — N6489 Other specified disorders of breast: Secondary | ICD-10-CM

## 2019-12-31 DIAGNOSIS — Z20822 Contact with and (suspected) exposure to covid-19: Secondary | ICD-10-CM | POA: Diagnosis not present

## 2019-12-31 DIAGNOSIS — Z03818 Encounter for observation for suspected exposure to other biological agents ruled out: Secondary | ICD-10-CM | POA: Diagnosis not present

## 2020-02-11 DIAGNOSIS — L700 Acne vulgaris: Secondary | ICD-10-CM | POA: Diagnosis not present

## 2020-02-11 DIAGNOSIS — L723 Sebaceous cyst: Secondary | ICD-10-CM | POA: Diagnosis not present

## 2020-02-11 DIAGNOSIS — L718 Other rosacea: Secondary | ICD-10-CM | POA: Diagnosis not present

## 2020-02-27 DIAGNOSIS — Z1329 Encounter for screening for other suspected endocrine disorder: Secondary | ICD-10-CM | POA: Diagnosis not present

## 2020-02-28 DIAGNOSIS — F4323 Adjustment disorder with mixed anxiety and depressed mood: Secondary | ICD-10-CM | POA: Diagnosis not present

## 2020-03-03 DIAGNOSIS — Z01419 Encounter for gynecological examination (general) (routine) without abnormal findings: Secondary | ICD-10-CM | POA: Diagnosis not present

## 2020-03-04 DIAGNOSIS — Z01419 Encounter for gynecological examination (general) (routine) without abnormal findings: Secondary | ICD-10-CM | POA: Diagnosis not present

## 2020-03-26 DIAGNOSIS — F4323 Adjustment disorder with mixed anxiety and depressed mood: Secondary | ICD-10-CM | POA: Diagnosis not present

## 2020-03-31 DIAGNOSIS — Z13220 Encounter for screening for lipoid disorders: Secondary | ICD-10-CM | POA: Diagnosis not present

## 2020-03-31 DIAGNOSIS — F4323 Adjustment disorder with mixed anxiety and depressed mood: Secondary | ICD-10-CM | POA: Diagnosis not present

## 2020-03-31 DIAGNOSIS — Z131 Encounter for screening for diabetes mellitus: Secondary | ICD-10-CM | POA: Diagnosis not present

## 2020-04-02 DIAGNOSIS — F4323 Adjustment disorder with mixed anxiety and depressed mood: Secondary | ICD-10-CM | POA: Diagnosis not present

## 2020-04-07 ENCOUNTER — Ambulatory Visit
Admission: RE | Admit: 2020-04-07 | Discharge: 2020-04-07 | Disposition: A | Discharge status: Home / Self Care | Payer: BC Managed Care – PPO | Source: Ambulatory Visit | Attending: Obstetrics and Gynecology | Admitting: Obstetrics and Gynecology

## 2020-04-07 ENCOUNTER — Other Ambulatory Visit: Payer: Self-pay

## 2020-04-07 DIAGNOSIS — Z0000 Encounter for general adult medical examination without abnormal findings: Secondary | ICD-10-CM | POA: Diagnosis not present

## 2020-04-07 DIAGNOSIS — Z13820 Encounter for screening for osteoporosis: Secondary | ICD-10-CM | POA: Diagnosis not present

## 2020-04-07 DIAGNOSIS — R030 Elevated blood-pressure reading, without diagnosis of hypertension: Secondary | ICD-10-CM | POA: Diagnosis not present

## 2020-04-07 DIAGNOSIS — N6489 Other specified disorders of breast: Secondary | ICD-10-CM

## 2020-04-07 DIAGNOSIS — Z1339 Encounter for screening examination for other mental health and behavioral disorders: Secondary | ICD-10-CM | POA: Diagnosis not present

## 2020-04-07 DIAGNOSIS — Z1331 Encounter for screening for depression: Secondary | ICD-10-CM | POA: Diagnosis not present

## 2020-04-10 DIAGNOSIS — F4323 Adjustment disorder with mixed anxiety and depressed mood: Secondary | ICD-10-CM | POA: Diagnosis not present

## 2020-04-11 ENCOUNTER — Other Ambulatory Visit: Payer: Self-pay | Admitting: Internal Medicine

## 2020-04-11 DIAGNOSIS — E7800 Pure hypercholesterolemia, unspecified: Secondary | ICD-10-CM

## 2020-04-17 DIAGNOSIS — F4323 Adjustment disorder with mixed anxiety and depressed mood: Secondary | ICD-10-CM | POA: Diagnosis not present

## 2020-04-29 ENCOUNTER — Ambulatory Visit
Admission: RE | Admit: 2020-04-29 | Discharge: 2020-04-29 | Disposition: A | Discharge status: Home / Self Care | Payer: BC Managed Care – PPO | Source: Ambulatory Visit | Attending: Internal Medicine | Admitting: Internal Medicine

## 2020-04-29 DIAGNOSIS — E7800 Pure hypercholesterolemia, unspecified: Secondary | ICD-10-CM | POA: Diagnosis not present

## 2020-05-01 DIAGNOSIS — F4323 Adjustment disorder with mixed anxiety and depressed mood: Secondary | ICD-10-CM | POA: Diagnosis not present

## 2020-05-14 DIAGNOSIS — M545 Low back pain, unspecified: Secondary | ICD-10-CM | POA: Diagnosis not present

## 2020-05-15 DIAGNOSIS — F4323 Adjustment disorder with mixed anxiety and depressed mood: Secondary | ICD-10-CM | POA: Diagnosis not present

## 2020-06-02 DIAGNOSIS — E782 Mixed hyperlipidemia: Secondary | ICD-10-CM | POA: Diagnosis not present

## 2020-06-02 DIAGNOSIS — R931 Abnormal findings on diagnostic imaging of heart and coronary circulation: Secondary | ICD-10-CM | POA: Diagnosis not present

## 2020-07-03 DIAGNOSIS — F4323 Adjustment disorder with mixed anxiety and depressed mood: Secondary | ICD-10-CM | POA: Diagnosis not present

## 2020-07-24 DIAGNOSIS — E782 Mixed hyperlipidemia: Secondary | ICD-10-CM | POA: Diagnosis not present

## 2020-08-06 DIAGNOSIS — L814 Other melanin hyperpigmentation: Secondary | ICD-10-CM | POA: Diagnosis not present

## 2020-08-06 DIAGNOSIS — L821 Other seborrheic keratosis: Secondary | ICD-10-CM | POA: Diagnosis not present

## 2020-08-29 DIAGNOSIS — Z6825 Body mass index (BMI) 25.0-25.9, adult: Secondary | ICD-10-CM | POA: Diagnosis not present

## 2020-08-29 DIAGNOSIS — R002 Palpitations: Secondary | ICD-10-CM | POA: Diagnosis not present

## 2020-08-29 DIAGNOSIS — E7800 Pure hypercholesterolemia, unspecified: Secondary | ICD-10-CM | POA: Diagnosis not present

## 2020-08-29 DIAGNOSIS — R9431 Abnormal electrocardiogram [ECG] [EKG]: Secondary | ICD-10-CM | POA: Diagnosis not present

## 2020-09-29 DIAGNOSIS — F4323 Adjustment disorder with mixed anxiety and depressed mood: Secondary | ICD-10-CM | POA: Diagnosis not present

## 2020-09-30 DIAGNOSIS — M1711 Unilateral primary osteoarthritis, right knee: Secondary | ICD-10-CM | POA: Diagnosis not present

## 2020-09-30 DIAGNOSIS — F4323 Adjustment disorder with mixed anxiety and depressed mood: Secondary | ICD-10-CM | POA: Diagnosis not present

## 2020-09-30 DIAGNOSIS — M2021 Hallux rigidus, right foot: Secondary | ICD-10-CM | POA: Diagnosis not present

## 2020-09-30 DIAGNOSIS — M25461 Effusion, right knee: Secondary | ICD-10-CM | POA: Diagnosis not present

## 2020-10-08 DIAGNOSIS — F4323 Adjustment disorder with mixed anxiety and depressed mood: Secondary | ICD-10-CM | POA: Diagnosis not present

## 2020-10-30 DIAGNOSIS — M71371 Other bursal cyst, right ankle and foot: Secondary | ICD-10-CM | POA: Diagnosis not present

## 2020-10-30 DIAGNOSIS — M2021 Hallux rigidus, right foot: Secondary | ICD-10-CM | POA: Diagnosis not present

## 2020-12-08 DIAGNOSIS — B009 Herpesviral infection, unspecified: Secondary | ICD-10-CM | POA: Diagnosis not present

## 2020-12-10 DIAGNOSIS — R1010 Upper abdominal pain, unspecified: Secondary | ICD-10-CM | POA: Diagnosis not present

## 2020-12-10 DIAGNOSIS — Z6825 Body mass index (BMI) 25.0-25.9, adult: Secondary | ICD-10-CM | POA: Diagnosis not present

## 2020-12-15 DIAGNOSIS — D252 Subserosal leiomyoma of uterus: Secondary | ICD-10-CM | POA: Diagnosis not present

## 2020-12-15 DIAGNOSIS — R1010 Upper abdominal pain, unspecified: Secondary | ICD-10-CM | POA: Diagnosis not present

## 2020-12-31 DIAGNOSIS — J0140 Acute pansinusitis, unspecified: Secondary | ICD-10-CM | POA: Diagnosis not present

## 2021-01-17 DIAGNOSIS — J329 Chronic sinusitis, unspecified: Secondary | ICD-10-CM | POA: Diagnosis not present

## 2021-01-30 DIAGNOSIS — F4323 Adjustment disorder with mixed anxiety and depressed mood: Secondary | ICD-10-CM | POA: Diagnosis not present

## 2021-02-05 DIAGNOSIS — F4323 Adjustment disorder with mixed anxiety and depressed mood: Secondary | ICD-10-CM | POA: Diagnosis not present

## 2021-02-12 DIAGNOSIS — F4323 Adjustment disorder with mixed anxiety and depressed mood: Secondary | ICD-10-CM | POA: Diagnosis not present

## 2021-02-18 DIAGNOSIS — F4323 Adjustment disorder with mixed anxiety and depressed mood: Secondary | ICD-10-CM | POA: Diagnosis not present

## 2021-03-04 DIAGNOSIS — F4323 Adjustment disorder with mixed anxiety and depressed mood: Secondary | ICD-10-CM | POA: Diagnosis not present

## 2021-04-01 DIAGNOSIS — N951 Menopausal and female climacteric states: Secondary | ICD-10-CM | POA: Diagnosis not present

## 2021-04-01 DIAGNOSIS — Z6825 Body mass index (BMI) 25.0-25.9, adult: Secondary | ICD-10-CM | POA: Diagnosis not present

## 2021-04-01 DIAGNOSIS — R5383 Other fatigue: Secondary | ICD-10-CM | POA: Diagnosis not present

## 2021-04-01 DIAGNOSIS — Z1329 Encounter for screening for other suspected endocrine disorder: Secondary | ICD-10-CM | POA: Diagnosis not present

## 2021-04-01 DIAGNOSIS — Z01419 Encounter for gynecological examination (general) (routine) without abnormal findings: Secondary | ICD-10-CM | POA: Diagnosis not present

## 2021-04-01 DIAGNOSIS — Z1231 Encounter for screening mammogram for malignant neoplasm of breast: Secondary | ICD-10-CM | POA: Diagnosis not present

## 2021-04-01 DIAGNOSIS — Z13820 Encounter for screening for osteoporosis: Secondary | ICD-10-CM | POA: Diagnosis not present

## 2021-04-01 DIAGNOSIS — R6882 Decreased libido: Secondary | ICD-10-CM | POA: Diagnosis not present

## 2021-04-02 DIAGNOSIS — Z124 Encounter for screening for malignant neoplasm of cervix: Secondary | ICD-10-CM | POA: Diagnosis not present

## 2021-04-28 DIAGNOSIS — F4323 Adjustment disorder with mixed anxiety and depressed mood: Secondary | ICD-10-CM | POA: Diagnosis not present

## 2021-04-28 DIAGNOSIS — Z0000 Encounter for general adult medical examination without abnormal findings: Secondary | ICD-10-CM | POA: Diagnosis not present

## 2021-04-28 DIAGNOSIS — Z1331 Encounter for screening for depression: Secondary | ICD-10-CM | POA: Diagnosis not present

## 2021-04-28 DIAGNOSIS — R030 Elevated blood-pressure reading, without diagnosis of hypertension: Secondary | ICD-10-CM | POA: Diagnosis not present

## 2021-05-25 DIAGNOSIS — H11152 Pinguecula, left eye: Secondary | ICD-10-CM | POA: Diagnosis not present

## 2021-05-25 DIAGNOSIS — H02889 Meibomian gland dysfunction of unspecified eye, unspecified eyelid: Secondary | ICD-10-CM | POA: Diagnosis not present

## 2021-05-25 DIAGNOSIS — H16223 Keratoconjunctivitis sicca, not specified as Sjogren's, bilateral: Secondary | ICD-10-CM | POA: Diagnosis not present

## 2021-05-25 DIAGNOSIS — H0288A Meibomian gland dysfunction right eye, upper and lower eyelids: Secondary | ICD-10-CM | POA: Diagnosis not present

## 2021-05-25 DIAGNOSIS — H2513 Age-related nuclear cataract, bilateral: Secondary | ICD-10-CM | POA: Diagnosis not present

## 2021-06-25 DIAGNOSIS — F4323 Adjustment disorder with mixed anxiety and depressed mood: Secondary | ICD-10-CM | POA: Diagnosis not present

## 2021-06-26 DIAGNOSIS — Z0000 Encounter for general adult medical examination without abnormal findings: Secondary | ICD-10-CM | POA: Diagnosis not present

## 2021-07-03 DIAGNOSIS — Z6825 Body mass index (BMI) 25.0-25.9, adult: Secondary | ICD-10-CM | POA: Diagnosis not present

## 2021-07-03 DIAGNOSIS — E7800 Pure hypercholesterolemia, unspecified: Secondary | ICD-10-CM | POA: Diagnosis not present

## 2021-07-10 DIAGNOSIS — I2510 Atherosclerotic heart disease of native coronary artery without angina pectoris: Secondary | ICD-10-CM | POA: Diagnosis not present

## 2021-07-10 DIAGNOSIS — Z01818 Encounter for other preprocedural examination: Secondary | ICD-10-CM | POA: Diagnosis not present

## 2021-07-10 DIAGNOSIS — R030 Elevated blood-pressure reading, without diagnosis of hypertension: Secondary | ICD-10-CM | POA: Diagnosis not present

## 2021-08-12 DIAGNOSIS — H16223 Keratoconjunctivitis sicca, not specified as Sjogren's, bilateral: Secondary | ICD-10-CM | POA: Diagnosis not present

## 2021-08-31 DIAGNOSIS — L299 Pruritus, unspecified: Secondary | ICD-10-CM | POA: Diagnosis not present

## 2021-09-04 DIAGNOSIS — F4323 Adjustment disorder with mixed anxiety and depressed mood: Secondary | ICD-10-CM | POA: Diagnosis not present

## 2021-09-08 DIAGNOSIS — F4323 Adjustment disorder with mixed anxiety and depressed mood: Secondary | ICD-10-CM | POA: Diagnosis not present

## 2021-09-15 DIAGNOSIS — F4323 Adjustment disorder with mixed anxiety and depressed mood: Secondary | ICD-10-CM | POA: Diagnosis not present

## 2021-09-16 DIAGNOSIS — F4323 Adjustment disorder with mixed anxiety and depressed mood: Secondary | ICD-10-CM | POA: Diagnosis not present

## 2021-09-17 DIAGNOSIS — F4323 Adjustment disorder with mixed anxiety and depressed mood: Secondary | ICD-10-CM | POA: Diagnosis not present

## 2021-09-23 DIAGNOSIS — H02403 Unspecified ptosis of bilateral eyelids: Secondary | ICD-10-CM | POA: Diagnosis not present

## 2021-09-24 DIAGNOSIS — F4323 Adjustment disorder with mixed anxiety and depressed mood: Secondary | ICD-10-CM | POA: Diagnosis not present

## 2021-10-01 DIAGNOSIS — F4323 Adjustment disorder with mixed anxiety and depressed mood: Secondary | ICD-10-CM | POA: Diagnosis not present

## 2021-10-12 DIAGNOSIS — F4323 Adjustment disorder with mixed anxiety and depressed mood: Secondary | ICD-10-CM | POA: Diagnosis not present

## 2021-10-19 DIAGNOSIS — Z23 Encounter for immunization: Secondary | ICD-10-CM | POA: Diagnosis not present

## 2021-10-23 DIAGNOSIS — F4323 Adjustment disorder with mixed anxiety and depressed mood: Secondary | ICD-10-CM | POA: Diagnosis not present

## 2021-10-26 DIAGNOSIS — Z23 Encounter for immunization: Secondary | ICD-10-CM | POA: Diagnosis not present

## 2021-10-29 DIAGNOSIS — F4323 Adjustment disorder with mixed anxiety and depressed mood: Secondary | ICD-10-CM | POA: Diagnosis not present

## 2021-11-05 DIAGNOSIS — F4323 Adjustment disorder with mixed anxiety and depressed mood: Secondary | ICD-10-CM | POA: Diagnosis not present

## 2021-11-17 DIAGNOSIS — F4323 Adjustment disorder with mixed anxiety and depressed mood: Secondary | ICD-10-CM | POA: Diagnosis not present

## 2021-12-07 DIAGNOSIS — R102 Pelvic and perineal pain: Secondary | ICD-10-CM | POA: Diagnosis not present

## 2021-12-07 DIAGNOSIS — R300 Dysuria: Secondary | ICD-10-CM | POA: Diagnosis not present

## 2021-12-07 DIAGNOSIS — R319 Hematuria, unspecified: Secondary | ICD-10-CM | POA: Diagnosis not present

## 2022-01-12 DIAGNOSIS — F4323 Adjustment disorder with mixed anxiety and depressed mood: Secondary | ICD-10-CM | POA: Diagnosis not present

## 2022-01-15 DIAGNOSIS — R002 Palpitations: Secondary | ICD-10-CM | POA: Diagnosis not present

## 2022-01-15 DIAGNOSIS — E7800 Pure hypercholesterolemia, unspecified: Secondary | ICD-10-CM | POA: Diagnosis not present

## 2022-01-20 DIAGNOSIS — H04123 Dry eye syndrome of bilateral lacrimal glands: Secondary | ICD-10-CM | POA: Diagnosis not present

## 2022-02-03 DIAGNOSIS — R002 Palpitations: Secondary | ICD-10-CM | POA: Diagnosis not present

## 2022-02-03 DIAGNOSIS — E7800 Pure hypercholesterolemia, unspecified: Secondary | ICD-10-CM | POA: Diagnosis not present

## 2022-02-04 DIAGNOSIS — F4323 Adjustment disorder with mixed anxiety and depressed mood: Secondary | ICD-10-CM | POA: Diagnosis not present

## 2022-02-06 DIAGNOSIS — E7800 Pure hypercholesterolemia, unspecified: Secondary | ICD-10-CM | POA: Diagnosis not present

## 2022-02-06 DIAGNOSIS — R002 Palpitations: Secondary | ICD-10-CM | POA: Diagnosis not present

## 2023-01-18 DIAGNOSIS — Z1329 Encounter for screening for other suspected endocrine disorder: Secondary | ICD-10-CM | POA: Diagnosis not present

## 2023-01-18 DIAGNOSIS — N951 Menopausal and female climacteric states: Secondary | ICD-10-CM | POA: Diagnosis not present

## 2023-01-18 DIAGNOSIS — R5383 Other fatigue: Secondary | ICD-10-CM | POA: Diagnosis not present

## 2023-01-20 DIAGNOSIS — H02831 Dermatochalasis of right upper eyelid: Secondary | ICD-10-CM | POA: Diagnosis not present

## 2023-01-20 DIAGNOSIS — H02834 Dermatochalasis of left upper eyelid: Secondary | ICD-10-CM | POA: Diagnosis not present

## 2023-01-21 DIAGNOSIS — E7800 Pure hypercholesterolemia, unspecified: Secondary | ICD-10-CM | POA: Diagnosis not present

## 2023-02-05 DIAGNOSIS — J069 Acute upper respiratory infection, unspecified: Secondary | ICD-10-CM | POA: Diagnosis not present

## 2023-02-18 DIAGNOSIS — H04123 Dry eye syndrome of bilateral lacrimal glands: Secondary | ICD-10-CM | POA: Diagnosis not present

## 2023-02-18 DIAGNOSIS — H1789 Other corneal scars and opacities: Secondary | ICD-10-CM | POA: Diagnosis not present

## 2023-02-18 DIAGNOSIS — H10829 Rosacea conjunctivitis, unspecified eye: Secondary | ICD-10-CM | POA: Diagnosis not present

## 2023-02-22 DIAGNOSIS — M25561 Pain in right knee: Secondary | ICD-10-CM | POA: Diagnosis not present

## 2023-02-22 DIAGNOSIS — M25461 Effusion, right knee: Secondary | ICD-10-CM | POA: Diagnosis not present

## 2023-02-22 DIAGNOSIS — M170 Bilateral primary osteoarthritis of knee: Secondary | ICD-10-CM | POA: Diagnosis not present

## 2023-03-01 DIAGNOSIS — M1711 Unilateral primary osteoarthritis, right knee: Secondary | ICD-10-CM | POA: Diagnosis not present

## 2023-03-17 DIAGNOSIS — N840 Polyp of corpus uteri: Secondary | ICD-10-CM | POA: Diagnosis not present

## 2023-05-05 DIAGNOSIS — N840 Polyp of corpus uteri: Secondary | ICD-10-CM | POA: Diagnosis not present

## 2023-05-10 DIAGNOSIS — Z881 Allergy status to other antibiotic agents status: Secondary | ICD-10-CM | POA: Diagnosis not present

## 2023-05-10 DIAGNOSIS — H02834 Dermatochalasis of left upper eyelid: Secondary | ICD-10-CM | POA: Diagnosis not present

## 2023-05-10 DIAGNOSIS — H02423 Myogenic ptosis of bilateral eyelids: Secondary | ICD-10-CM | POA: Diagnosis not present

## 2023-05-10 DIAGNOSIS — H02831 Dermatochalasis of right upper eyelid: Secondary | ICD-10-CM | POA: Diagnosis not present

## 2023-05-10 DIAGNOSIS — H02403 Unspecified ptosis of bilateral eyelids: Secondary | ICD-10-CM | POA: Diagnosis not present

## 2023-05-23 DIAGNOSIS — L0390 Cellulitis, unspecified: Secondary | ICD-10-CM | POA: Diagnosis not present

## 2023-07-18 DIAGNOSIS — Z1231 Encounter for screening mammogram for malignant neoplasm of breast: Secondary | ICD-10-CM | POA: Diagnosis not present

## 2023-07-18 DIAGNOSIS — Z1331 Encounter for screening for depression: Secondary | ICD-10-CM | POA: Diagnosis not present

## 2023-07-18 DIAGNOSIS — E871 Hypo-osmolality and hyponatremia: Secondary | ICD-10-CM | POA: Diagnosis not present

## 2023-07-18 DIAGNOSIS — Z23 Encounter for immunization: Secondary | ICD-10-CM | POA: Diagnosis not present

## 2023-07-18 DIAGNOSIS — Z0000 Encounter for general adult medical examination without abnormal findings: Secondary | ICD-10-CM | POA: Diagnosis not present

## 2023-07-25 DIAGNOSIS — N951 Menopausal and female climacteric states: Secondary | ICD-10-CM | POA: Diagnosis not present

## 2023-07-25 DIAGNOSIS — Z6825 Body mass index (BMI) 25.0-25.9, adult: Secondary | ICD-10-CM | POA: Diagnosis not present

## 2023-07-25 DIAGNOSIS — Z01419 Encounter for gynecological examination (general) (routine) without abnormal findings: Secondary | ICD-10-CM | POA: Diagnosis not present

## 2023-07-25 DIAGNOSIS — Z124 Encounter for screening for malignant neoplasm of cervix: Secondary | ICD-10-CM | POA: Diagnosis not present

## 2023-11-02 DIAGNOSIS — J069 Acute upper respiratory infection, unspecified: Secondary | ICD-10-CM | POA: Diagnosis not present
